# Patient Record
Sex: Female | Born: 1970 | Race: White | Hispanic: No | Marital: Married | Smoking: Never smoker
Health system: Southern US, Community
[De-identification: ages and names within clinical notes are randomized; demographics above are authoritative.]

## PROBLEM LIST (undated history)

## (undated) DIAGNOSIS — G43909 Migraine, unspecified, not intractable, without status migrainosus: Secondary | ICD-10-CM

## (undated) HISTORY — PX: OTHER SURGICAL HISTORY: SHX169

## (undated) HISTORY — PX: BREAST SURGERY: SHX581

---

## 2016-05-04 DIAGNOSIS — R5383 Other fatigue: Secondary | ICD-10-CM | POA: Diagnosis not present

## 2016-05-04 DIAGNOSIS — E039 Hypothyroidism, unspecified: Secondary | ICD-10-CM | POA: Diagnosis not present

## 2016-05-04 DIAGNOSIS — N943 Premenstrual tension syndrome: Secondary | ICD-10-CM | POA: Diagnosis not present

## 2016-05-04 DIAGNOSIS — E7210 Disorders of sulfur-bearing amino-acid metabolism, unspecified: Secondary | ICD-10-CM | POA: Diagnosis not present

## 2016-05-04 DIAGNOSIS — E559 Vitamin D deficiency, unspecified: Secondary | ICD-10-CM | POA: Diagnosis not present

## 2016-05-30 DIAGNOSIS — E7210 Disorders of sulfur-bearing amino-acid metabolism, unspecified: Secondary | ICD-10-CM | POA: Diagnosis not present

## 2016-05-30 DIAGNOSIS — N943 Premenstrual tension syndrome: Secondary | ICD-10-CM | POA: Diagnosis not present

## 2016-05-30 DIAGNOSIS — R51 Headache: Secondary | ICD-10-CM | POA: Diagnosis not present

## 2016-05-30 DIAGNOSIS — F411 Generalized anxiety disorder: Secondary | ICD-10-CM | POA: Diagnosis not present

## 2016-07-01 DIAGNOSIS — Z79899 Other long term (current) drug therapy: Secondary | ICD-10-CM | POA: Diagnosis not present

## 2016-07-01 DIAGNOSIS — F902 Attention-deficit hyperactivity disorder, combined type: Secondary | ICD-10-CM | POA: Diagnosis not present

## 2016-07-01 DIAGNOSIS — R41840 Attention and concentration deficit: Secondary | ICD-10-CM | POA: Diagnosis not present

## 2016-07-01 DIAGNOSIS — F419 Anxiety disorder, unspecified: Secondary | ICD-10-CM | POA: Diagnosis not present

## 2016-08-22 DIAGNOSIS — R5383 Other fatigue: Secondary | ICD-10-CM | POA: Diagnosis not present

## 2016-08-22 DIAGNOSIS — E559 Vitamin D deficiency, unspecified: Secondary | ICD-10-CM | POA: Diagnosis not present

## 2016-08-22 DIAGNOSIS — E039 Hypothyroidism, unspecified: Secondary | ICD-10-CM | POA: Diagnosis not present

## 2016-08-22 DIAGNOSIS — F411 Generalized anxiety disorder: Secondary | ICD-10-CM | POA: Diagnosis not present

## 2016-09-05 DIAGNOSIS — F411 Generalized anxiety disorder: Secondary | ICD-10-CM | POA: Diagnosis not present

## 2016-09-05 DIAGNOSIS — R51 Headache: Secondary | ICD-10-CM | POA: Diagnosis not present

## 2016-09-05 DIAGNOSIS — N943 Premenstrual tension syndrome: Secondary | ICD-10-CM | POA: Diagnosis not present

## 2016-09-05 DIAGNOSIS — E7210 Disorders of sulfur-bearing amino-acid metabolism, unspecified: Secondary | ICD-10-CM | POA: Diagnosis not present

## 2016-09-28 ENCOUNTER — Encounter: Payer: Self-pay | Admitting: *Deleted

## 2016-09-28 ENCOUNTER — Emergency Department (INDEPENDENT_AMBULATORY_CARE_PROVIDER_SITE_OTHER)
Admission: EM | Admit: 2016-09-28 | Discharge: 2016-09-28 | Disposition: A | Discharge status: Home / Self Care | Payer: BLUE CROSS/BLUE SHIELD | Source: Home / Self Care | Attending: Family Medicine | Admitting: Family Medicine

## 2016-09-28 DIAGNOSIS — G43919 Migraine, unspecified, intractable, without status migrainosus: Secondary | ICD-10-CM | POA: Diagnosis not present

## 2016-09-28 HISTORY — DX: Migraine, unspecified, not intractable, without status migrainosus: G43.909

## 2016-09-28 MED ORDER — DEXAMETHASONE SODIUM PHOSPHATE 10 MG/ML IJ SOLN
10.0000 mg | Freq: Once | INTRAMUSCULAR | Status: AC
  Administered 2016-09-28: 10 mg via INTRAMUSCULAR

## 2016-09-28 MED ORDER — ONDANSETRON 4 MG PO TBDP
4.0000 mg | ORAL_TABLET | Freq: Once | ORAL | Status: AC
  Administered 2016-09-28: 4 mg via ORAL

## 2016-09-28 MED ORDER — KETOROLAC TROMETHAMINE 60 MG/2ML IJ SOLN
60.0000 mg | Freq: Once | INTRAMUSCULAR | Status: AC
  Administered 2016-09-28: 60 mg via INTRAMUSCULAR

## 2016-09-28 NOTE — Discharge Instructions (Signed)
Rest. Remain well hydrated.  If symptoms become significantly worse during the night or over the weekend, proceed to the local emergency room.  

## 2016-09-28 NOTE — ED Triage Notes (Signed)
Patient c/o migraine HA x 3 days. Taken Imitrex without relief. Went to [facility] and received a [employer] Cocktail and received a massage. Pain still persists with nausea.

## 2016-09-28 NOTE — ED Provider Notes (Signed)
[facility]    CSN: [id number] Arrival date & time: 09/28/16  1021     History   Chief Complaint Chief Complaint  Patient presents with  . Headache    HPI Madison [name] is a 46 y.o. female.   Patient has a long history of migraine headaches, usually related to onset of menses.  Her headaches have been under control during the past several years, responding to Imitrex. Patient developed a recurrent typical headache two days ago while at work.  She had no improvement after taking Imitrex.  She went to [facility] where she received a "Myers cocktail" and massage without improvement.  She has had nausea without vomiting.   The history is provided by the patient.  Headache  Pain location:  Generalized Quality:  Dull Radiates to:  Does not radiate Severity currently:  8/10 Onset quality:  Sudden Duration:  3 days Timing:  Constant Progression:  Unchanged Chronicity:  Recurrent Similar to prior headaches: yes   Context: activity   Relieved by:  Nothing Worsened by:  Light Ineffective treatments: Imitrex. Associated symptoms: fatigue, nausea and photophobia   Associated symptoms: no abdominal pain, no blurred vision, no congestion, no dizziness, no drainage, no ear pain, no eye pain, no facial pain, no fever, no focal weakness, no hearing loss, no loss of balance, no myalgias, no near-syncope, no neck pain, no neck stiffness, no numbness, no paresthesias, no seizures, no sinus pressure, no sore throat, no visual change, no vomiting and no weakness     Past Medical History:  Diagnosis Date  . Migraine     There are no active problems to display for this patient.   Past Surgical History:  Procedure Laterality Date  . BREAST SURGERY    . OTHER SURGICAL HISTORY     Various reconstruction from accident    OB History    No data available       Home Medications    Prior to Admission medications   Medication Sig Start Date End Date Taking?  Authorizing Provider  progesterone (PROMETRIUM) 100 MG capsule Take 300 mg by mouth daily.   Yes Historical Provider, MD  THYROID PO Take by mouth.   Yes Historical Provider, MD    Family History History reviewed. No pertinent family history.  Social History Social History  Substance Use Topics  . Smoking status: Never Smoker  . Smokeless tobacco: Never Used  . Alcohol use No     Allergies   Lactose intolerance (gi)   Review of Systems Review of Systems  Constitutional: Positive for fatigue. Negative for fever.  HENT: Negative for congestion, ear pain, hearing loss, postnasal drip, sinus pressure and sore throat.   Eyes: Positive for photophobia. Negative for blurred vision and pain.  Cardiovascular: Negative for near-syncope.  Gastrointestinal: Positive for nausea. Negative for abdominal pain and vomiting.  Musculoskeletal: Negative for myalgias, neck pain and neck stiffness.  Neurological: Positive for headaches. Negative for dizziness, focal weakness, seizures, weakness, numbness, paresthesias and loss of balance.  All other systems reviewed and are negative.    Physical Exam Triage Vital Signs ED Triage Vitals  Enc Vitals Group     BP 09/28/16 1113 116/80     Pulse Rate 09/28/16 1113 80     Resp --      Temp 09/28/16 1113 98.2 F (36.8 C)     Temp Source 09/28/16 1113 Oral     SpO2 09/28/16 1113 98 %     Weight 09/28/16 1114  133 lb (60.3 kg)     Height 09/28/16 1114 5\' 4"  (1.626 m)     Head Circumference --      Peak Flow --      Pain Score 09/28/16 1118 8     Pain Loc --      Pain Edu? --      Excl. in GC? --    No data found.   Updated Vital Signs BP 116/80 (BP Location: Left Arm)   Pulse 80   Temp 98.2 F (36.8 C) (Oral)   Ht 5\' 4"  (1.626 m)   Wt 133 lb (60.3 kg)   LMP 09/02/2016   SpO2 98%   BMI 22.83 kg/m   Visual Acuity Right Eye Distance:   Left Eye Distance:   Bilateral Distance:    Right Eye Near:   Left Eye Near:    Bilateral  Near:     Physical Exam Nursing notes and Vital Signs reviewed. Appearance:  Patient appears stated age, and in no acute distress Eyes:  Pupils are equal, round, and reactive to light and accomodation.  Extraocular movement is intact.  Conjunctivae are not inflamed.  Fundi benign.  Mild photophobia present. Ears:  Canals normal.  Tympanic membranes normal.  Nose:   Normal turbinates.  No sinus tenderness.   Pharynx:  Normal Neck:  Supple.  No adenopathy Lungs:  Clear to auscultation.  Breath sounds are equal.  Moving air well. Heart:  Regular rate and rhythm without murmurs, rubs, or gallops.  Abdomen:  Nontender without masses or hepatosplenomegaly.  Bowel sounds are present.  No CVA or flank tenderness.  Extremities:  No edema.  Skin:  No rash present.   Neurologic:  Cranial nerves 2 through 12 are normal.  Patellar, achilles, and elbow reflexes are normal.  Cerebellar function is intact (finger-to-nose and rapid alternating hand movement).  Gait and station are normal.    UC Treatments / Results  Labs (all labs ordered are listed, but only abnormal results are displayed) Labs Reviewed - No data to display  EKG  EKG Interpretation None       Radiology No results found.  Procedures Procedures (including critical care time)  Medications Ordered in UC Medications  ketorolac (TORADOL) injection 60 mg (not administered)  dexamethasone (DECADRON) injection 10 mg (not administered)  ondansetron (ZOFRAN-ODT) disintegrating tablet 4 mg (4 mg Oral Given 09/28/16 1119)     Initial Impression / Assessment and Plan / UC Course  I have reviewed the triage vital signs and the nursing notes.  Pertinent labs & imaging results that were available during my care of the patient were reviewed by me and considered in my medical decision making (see chart for details).  Clinical Course   Administered Zofran ODT 4mg  PO  Administered Toradol 60mg  IM  Administered Decadron 10mg  IM.  Rest.   Remain well hydrated. If symptoms become significantly worse during the night or over the weekend, proceed to the local emergency room.  Followup with Family Doctor if not improved in 5 days.    Final Clinical Impressions(s) / UC Diagnoses   Final diagnoses:  Intractable migraine without status migrainosus, unspecified migraine type    New Prescriptions New Prescriptions   No medications on file     Lattie HawStephen A Beese, MD 10/12/16 [phone]

## 2016-10-07 DIAGNOSIS — Z1231 Encounter for screening mammogram for malignant neoplasm of breast: Secondary | ICD-10-CM | POA: Diagnosis not present

## 2017-01-19 DIAGNOSIS — N943 Premenstrual tension syndrome: Secondary | ICD-10-CM | POA: Diagnosis not present

## 2017-01-19 DIAGNOSIS — E7210 Disorders of sulfur-bearing amino-acid metabolism, unspecified: Secondary | ICD-10-CM | POA: Diagnosis not present

## 2017-01-19 DIAGNOSIS — F411 Generalized anxiety disorder: Secondary | ICD-10-CM | POA: Diagnosis not present

## 2017-01-19 DIAGNOSIS — R51 Headache: Secondary | ICD-10-CM | POA: Diagnosis not present

## 2017-03-09 DIAGNOSIS — E7210 Disorders of sulfur-bearing amino-acid metabolism, unspecified: Secondary | ICD-10-CM | POA: Diagnosis not present

## 2017-03-09 DIAGNOSIS — F411 Generalized anxiety disorder: Secondary | ICD-10-CM | POA: Diagnosis not present

## 2017-03-09 DIAGNOSIS — N943 Premenstrual tension syndrome: Secondary | ICD-10-CM | POA: Diagnosis not present

## 2017-03-09 DIAGNOSIS — R51 Headache: Secondary | ICD-10-CM | POA: Diagnosis not present

## 2017-03-09 DIAGNOSIS — E559 Vitamin D deficiency, unspecified: Secondary | ICD-10-CM | POA: Diagnosis not present

## 2017-04-14 DIAGNOSIS — H5203 Hypermetropia, bilateral: Secondary | ICD-10-CM | POA: Diagnosis not present

## 2017-09-28 DIAGNOSIS — F411 Generalized anxiety disorder: Secondary | ICD-10-CM | POA: Diagnosis not present

## 2017-09-28 DIAGNOSIS — R51 Headache: Secondary | ICD-10-CM | POA: Diagnosis not present

## 2017-09-28 DIAGNOSIS — E7210 Disorders of sulfur-bearing amino-acid metabolism, unspecified: Secondary | ICD-10-CM | POA: Diagnosis not present

## 2017-09-28 DIAGNOSIS — N943 Premenstrual tension syndrome: Secondary | ICD-10-CM | POA: Diagnosis not present

## 2017-11-24 DIAGNOSIS — N943 Premenstrual tension syndrome: Secondary | ICD-10-CM | POA: Diagnosis not present

## 2017-11-24 DIAGNOSIS — E7210 Disorders of sulfur-bearing amino-acid metabolism, unspecified: Secondary | ICD-10-CM | POA: Diagnosis not present

## 2017-11-24 DIAGNOSIS — R51 Headache: Secondary | ICD-10-CM | POA: Diagnosis not present

## 2017-11-24 DIAGNOSIS — E559 Vitamin D deficiency, unspecified: Secondary | ICD-10-CM | POA: Diagnosis not present

## 2017-11-24 DIAGNOSIS — F411 Generalized anxiety disorder: Secondary | ICD-10-CM | POA: Diagnosis not present

## 2018-01-18 DIAGNOSIS — E7210 Disorders of sulfur-bearing amino-acid metabolism, unspecified: Secondary | ICD-10-CM | POA: Diagnosis not present

## 2018-01-18 DIAGNOSIS — R51 Headache: Secondary | ICD-10-CM | POA: Diagnosis not present

## 2018-01-18 DIAGNOSIS — N943 Premenstrual tension syndrome: Secondary | ICD-10-CM | POA: Diagnosis not present

## 2018-01-18 DIAGNOSIS — F411 Generalized anxiety disorder: Secondary | ICD-10-CM | POA: Diagnosis not present

## 2018-01-22 ENCOUNTER — Encounter: Payer: Self-pay | Admitting: Neurology

## 2018-02-20 ENCOUNTER — Ambulatory Visit (INDEPENDENT_AMBULATORY_CARE_PROVIDER_SITE_OTHER): Payer: BLUE CROSS/BLUE SHIELD | Admitting: Neurology

## 2018-02-20 ENCOUNTER — Encounter: Payer: Self-pay | Admitting: Neurology

## 2018-02-20 VITALS — BP 106/68 | HR 72 | Ht 64.0 in | Wt 132.0 lb

## 2018-02-20 DIAGNOSIS — M542 Cervicalgia: Secondary | ICD-10-CM | POA: Diagnosis not present

## 2018-02-20 DIAGNOSIS — G43829 Menstrual migraine, not intractable, without status migrainosus: Secondary | ICD-10-CM

## 2018-02-20 MED ORDER — NAPROXEN 500 MG PO TABS: ORAL_TABLET | ORAL | 2 refills | Status: DC

## 2018-02-20 NOTE — Patient Instructions (Addendum)
Migraine Recommendations: 1.  I will refer you to Dr. Antoine Primas of [facility] to treat your neck pain (which I think is causing your headaches). They should call you directly with an appointment. If you do not hear from them please call [phone]. 2.  Take sumatriptan 50mg  with naproxen 500mg  at earliest onset of headache.  May repeat dose once in 2 hours if needed.  Do not exceed two naproxen in 24 hours (therefore, if you need to take more sumatriptan, you cannot take it with naproxen). 3.  Limit use of pain relievers to no more than 2 days out of the week.  These medications include acetaminophen, ibuprofen, triptans and narcotics.  This will help reduce risk of rebound headaches. 4.  Be aware of common food triggers such as processed sweets, processed foods with nitrites (such as deli meat, hot dogs, sausages), foods with MSG, alcohol (such as wine), chocolate, certain cheeses, certain fruits (dried fruits, bananas, some citrus fruit), vinegar, diet soda. 4.  Avoid caffeine 5.  Routine exercise 6.  Proper sleep hygiene 7.  Stay adequately hydrated with water 8.  Keep a headache diary. 9.  Maintain proper stress management. 10.  Do not skip meals. 11.  Consider supplements:  Magnesium citrate 400mg  to 600mg  daily, riboflavin 400mg , Coenzyme Q 10 100mg  three times daily 12.  Follow up in 3 months.   Migraine Headache A migraine headache is an intense, throbbing pain on one side or both sides of the head. Migraines may also cause other symptoms, such as nausea, vomiting, and sensitivity to light and noise. What are the causes? Doing or taking certain things may also trigger migraines, such as:  Alcohol.  Smoking.  Medicines, such as: ? Medicine used to treat chest pain (nitroglycerine). ? Birth control pills. ? Estrogen pills. ? Certain blood pressure medicines.  Aged cheeses, chocolate, or caffeine.  Foods or drinks that contain nitrates, glutamate, aspartame,  or tyramine.  Physical activity.  Other things that may trigger a migraine include:  Menstruation.  Pregnancy.  Hunger.  Stress, lack of sleep, too much sleep, or fatigue.  Weather changes.  What increases the risk? The following factors may make you more likely to experience migraine headaches:  Age. Risk increases with age.  Family history of migraine headaches.  Being Caucasian.  Depression and anxiety.  Obesity.  Being a woman.  Having a hole in the heart (patent foramen ovale) or other heart problems.  What are the signs or symptoms? The main symptom of this condition is pulsating or throbbing pain. Pain may:  Happen in any area of the head, such as on one side or both sides.  Interfere with daily activities.  Get worse with physical activity.  Get worse with exposure to bright lights or loud noises.  Other symptoms may include:  Nausea.  Vomiting.  Dizziness.  General sensitivity to bright lights, loud noises, or smells.  Before you get a migraine, you may get warning signs that a migraine is developing (aura). An aura may include:  Seeing flashing lights or having blind spots.  Seeing bright spots, halos, or zigzag lines.  Having tunnel vision or blurred vision.  Having numbness or a tingling feeling.  Having trouble talking.  Having muscle weakness.  How is this diagnosed? A migraine headache can be diagnosed based on:  Your symptoms.  A physical exam.  Tests, such as CT scan or MRI of the head. These imaging tests can help rule out other causes of  headaches.  Taking fluid from the spine (lumbar puncture) and analyzing it (cerebrospinal fluid analysis, or CSF analysis).  How is this treated? A migraine headache is usually treated with medicines that:  Relieve pain.  Relieve nausea.  Prevent migraines from coming back.  Treatment may also include:  Acupuncture.  Lifestyle changes like avoiding foods that trigger  migraines.  Follow these instructions at home: Medicines  Take over-the-counter and prescription medicines only as told by your health care provider.  Do not drive or use heavy machinery while taking prescription pain medicine.  To prevent or treat constipation while you are taking prescription pain medicine, your health care provider may recommend that you: ? Drink enough fluid to keep your urine clear or pale yellow. ? Take over-the-counter or prescription medicines. ? Eat foods that are high in fiber, such as fresh fruits and vegetables, whole grains, and beans. ? Limit foods that are high in fat and processed sugars, such as fried and sweet foods. Lifestyle  Avoid alcohol use.  Do not use any products that contain nicotine or tobacco, such as cigarettes and e-cigarettes. If you need help quitting, ask your health care provider.  Get at least 8 hours of sleep every night.  Limit your stress. General instructions   Keep a journal to find out what may trigger your migraine headaches. For example, write down: ? What you eat and drink. ? How much sleep you get. ? Any change to your diet or medicines.  If you have a migraine: ? Avoid things that make your symptoms worse, such as bright lights. ? It may help to lie down in a dark, quiet room. ? Do not drive or use heavy machinery. ? Ask your health care provider what activities are safe for you while you are experiencing symptoms.  Keep all follow-up visits as told by your health care provider. This is important. Contact a health care provider if:  You develop symptoms that are different or more severe than your usual migraine symptoms. Get help right away if:  Your migraine becomes severe.  You have a fever.  You have a stiff neck.  You have vision loss.  Your muscles feel weak or like you cannot control them.  You start to lose your balance often.  You develop trouble walking.  You faint. This information is  not intended to replace advice given to you by your health care provider. Make sure you discuss any questions you have with your health care provider. Document Released: 09/05/2005 Document Revised: 03/25/2016 Document Reviewed: 02/22/2016 Elsevier Interactive Patient Education  2017 [employer]

## 2018-02-20 NOTE — Progress Notes (Signed)
NEUROLOGY CONSULTATION NOTE  Madison [name] MRN: [id number] DOB: [birth year]  Referring provider: Ailene RavelJenny Addison, FNP Primary care provider: Ailene RavelJenny Addison, FNP  Reason for consult:  migraines  HISTORY OF PRESENT ILLNESS: Madison [name] is a 47 year old right-handed female with migraine, anxiety, hypothyroidism who presents for migraines.  History supplemented by referring provider's note  Onset:  Since childhood but worse over past 2 years Location:  Bilateral or unilateral from shoulders up neck to back of head and behind the eyes Quality:  thumping Intensity:  severe.  She denies new headache, thunderclap headache or severe headache that wakes her from sleep. Aura:  no Prodrome:  no Postdrome:  no Associated symptoms:  Nausea, photophobia, phonophobia, sees flashing lights when eyes closed.  Sometimes has vomiting.  She denies associated unilateral numbness or weakness. Duration:  1 to 2 hours with sumatriptan 50mg  but will return later that day.   Frequency:  5-6 times a month. She has one migraine a month that lasts 3 days.  It occurs anywhere from 2 weeks to week of her cycle. Frequency of abortive medication: as needed Triggers/exacerbating factors:  Eye strain (staring at computer screen), menstrual cycle, heat Relieving factors:  Heated rice packs, resting in quiet dark room Activity:  aggravates  Rescue protocol:  1st. Ibuprofen 600mg ; 2nd sumatriptan 50mg  (may repeat 3 more times every 2 hours) Current NSAIDS:  Ibuprofen 600mg  Current analgesics:  no Current triptans:  sumatriptan 50mg  (second dose makes her groggy) Current anti-emetic:  no Current muscle relaxants:  baclofen 10mg  Current anti-anxiolytic:  no Current sleep aide:  no Current Antihypertensive medications:  no Current Antidepressant medications:  no Current Anticonvulsant medications:  no Current Vitamins/Herbal/Supplements:  Magnesium, B2, B3, K2, D3, probiotic, MVI Current  Antihistamines/Decongestants:  no Other therapy:  no Birth control:  progesterone200mg  day 17-28 of cycle  Past NSAIDS:  no Past analgesics:  Tylenol, Excedrin Past abortive triptans:  Relpax (made migraines worse) Past muscle relaxants:  no Past anti-emetic:  no Past antihypertensive medications:  no Past antidepressant medications:  no Past anticonvulsant medications:  no Past vitamins/Herbal/Supplements:  no Past antihistamines/decongestants:  no Other past therapies:  Acupuncture, massage, cupping  Caffeine:  1 cup coffee daily Alcohol:  no Smoker:  no Diet:  Hydrates.  No soda.  Eats healthy Exercise:  yes Depression:  no; Anxiety:  yes Other pain:  Neck pain Sleep hygiene:  okay Family history of headache:  Father, sister  11/24/17:  CMP with Na 142, K 4.9, glucose 78, BUN 14, Cr 0.95, t bili 0.6, ALP 62, AST 16 and ALT 12  PAST MEDICAL HISTORY: Past Medical History:  Diagnosis Date  . Migraine     PAST SURGICAL HISTORY: Past Surgical History:  Procedure Laterality Date  . BREAST SURGERY    . OTHER SURGICAL HISTORY     Various reconstruction from accident    MEDICATIONS: Current Outpatient Medications on File Prior to Visit  Medication Sig Dispense Refill  . Cholecalciferol (VITAMIN D3) 1000 units CAPS Take 1 capsule by mouth daily.    . Menaquinone-7 (VITAMIN K2) 100 MCG CAPS Take 1 capsule by mouth daily.    . Multiple Vitamin (MULTIVITAMIN) tablet Take 1 tablet by mouth daily.    . Niacin (VITAMIN B-3 PO) Take 1 capsule by mouth daily.    . Probiotic Product (PROBIOTIC-10 PO) Take 1 capsule by mouth daily.    . riboflavin (VITAMIN B-2) 100 MG TABS tablet Take 100 mg by mouth daily.    .Marland Kitchen  progesterone (PROMETRIUM) 100 MG capsule Take 300 mg by mouth daily.    . SUMAtriptan (IMITREX) 50 MG tablet Take 50 mg by mouth as directed.  1  . THYROID PO Take by mouth.     No current facility-administered medications on file prior to visit.      ALLERGIES: Allergies  Allergen Reactions  . Lactose Intolerance (Gi)     FAMILY HISTORY: Family History  Problem Relation Age of Onset  . Depression Mother   . Hypertension Father   . Diabetes Father     SOCIAL HISTORY: Social History   Socioeconomic History  . Marital status: Married    Spouse name: Greggory Stallion "Bud"  . Number of children: 2  . Years of education: Not on file  . Highest education level: Bachelor's degree (e.g., BA, AB, BS)  Occupational History  . 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 Financial resource strain: Not on file  . Food insecurity:    Worry: Not on file    Inability: Not on file  . Transportation needs:    Medical: Not on file    Non-medical: Not on file  Tobacco Use  . Smoking status: Never Smoker  . Smokeless tobacco: Never Used  Substance and Sexual Activity  . Alcohol use: No  . Drug use: No  . Sexual activity: Not on file  Lifestyle  . Physical activity:    Days per week: Not on file    Minutes per session: Not on file  . Stress: Not on file  Relationships  . Social connections:    Talks on phone: Not on file    Gets together: Not on file    Attends religious service: Not on file    Active member of club or organization: Not on file    Attends meetings of clubs or organizations: Not on file    Relationship status: Not on file  . Intimate partner violence:    Fear of current or ex partner: Not on file    Emotionally abused: Not on file    Physically abused: Not on file    Forced sexual activity: Not on file  Other Topics Concern  . Not on file  Social History Narrative   Patient is right-handed. She lives with her husband and 2 teenagers in a 2 story house. They have one dog. Patient drinks 1 cup of coffee a day. She walks 2-3 miles, goes to the gym or does yoga daily.     REVIEW OF SYSTEMS: Constitutional: No fevers, chills, or sweats, no generalized fatigue, change in  appetite Eyes: No visual changes, double vision, eye pain Ear, nose and throat: No hearing loss, ear pain, nasal congestion, sore throat Cardiovascular: No chest pain, palpitations Respiratory:  No shortness of breath at rest or with exertion, wheezes GastrointestinaI: No nausea, vomiting, diarrhea, abdominal pain, fecal incontinence Genitourinary:  No dysuria, urinary retention or frequency Musculoskeletal:  Neck pain Integumentary: No rash, pruritus, skin lesions Neurological: as above Psychiatric: anxiety Endocrine: No palpitations, fatigue, diaphoresis, mood swings, change in appetite, change in weight, increased thirst Hematologic/Lymphatic:  No purpura, petechiae. Allergic/Immunologic: no itchy/runny eyes, nasal congestion, recent allergic reactions, rashes  PHYSICAL EXAM: Vitals:   02/20/18 1555  BP: 106/68  Pulse: 72  SpO2: 98%   General: No acute distress.  Patient appears well-groomed.  Head:  Normocephalic/atraumatic Eyes:  fundi examined but not visualized Neck: supple, paraspinal tenderness, full range of motion Back: No paraspinal  tenderness Heart: regular rate and rhythm Lungs: Clear to auscultation bilaterally. Vascular: No carotid bruits. Neurological Exam: Mental status: alert and oriented to person, place, and time, recent and remote memory intact, fund of knowledge intact, attention and concentration intact, speech fluent and not dysarthric, language intact. Cranial nerves: CN I: not tested CN II: pupils equal, round and reactive to light, visual fields intact CN III, IV, VI:  full range of motion, no nystagmus, no ptosis CN V: facial sensation intact CN VII: upper and lower face symmetric CN VIII: hearing intact CN IX, X: gag intact, uvula midline CN XI: sternocleidomastoid and trapezius muscles intact CN XII: tongue midline Bulk & Tone: normal, no fasciculations. Motor:  5/5 throughout  Sensation: temperature and vibration sensation intact. Deep  Tendon Reflexes:  2+ throughout, toes downgoing.  Finger to nose testing:  Without dysmetria.  Heel to shin:  Without dysmetria.  Gait:  Normal station and stride.  Able to turn and tandem walk. Romberg negative.  IMPRESSION: Menstrually-related migraines, not intractable.  There is also a cervicogenic component as well.  PLAN: She would like to avoid pharmacologic treatment for now. 1.  Refer to Sports Medicine for treatment of neck pain 2.  Increasing sumatriptan may cause more drowsiness.  Instead, I will have her take naproxen 500mg  with sumatriptan 50mg .  She may repeat dose with second sumatriptan in 2 hours if needed (no more than 2 tablets in 24 hours) 3.  Limit use of pain relievers to no more than 2 days out of week 4.  Keep headache diary 5.  Mg, B2, CoQ10 6.  Follow up in 3 months.  If headaches not improved, we discussed topiramate.  Thank you for allowing me to take part in the care of this patient.  Shon Millet, DO  CC:  Ailene Ravel, FNP

## 2018-02-28 ENCOUNTER — Encounter: Payer: Self-pay | Admitting: Family Medicine

## 2018-02-28 ENCOUNTER — Other Ambulatory Visit: Payer: Self-pay | Admitting: *Deleted

## 2018-02-28 ENCOUNTER — Ambulatory Visit (INDEPENDENT_AMBULATORY_CARE_PROVIDER_SITE_OTHER): Payer: BLUE CROSS/BLUE SHIELD | Admitting: Family Medicine

## 2018-02-28 ENCOUNTER — Ambulatory Visit (INDEPENDENT_AMBULATORY_CARE_PROVIDER_SITE_OTHER)
Admission: RE | Admit: 2018-02-28 | Discharge: 2018-02-28 | Disposition: A | Discharge status: Home / Self Care | Payer: BLUE CROSS/BLUE SHIELD | Source: Ambulatory Visit | Attending: Family Medicine | Admitting: Family Medicine

## 2018-02-28 VITALS — BP 110/80 | HR 69 | Ht 64.0 in | Wt 132.8 lb

## 2018-02-28 DIAGNOSIS — M999 Biomechanical lesion, unspecified: Secondary | ICD-10-CM | POA: Diagnosis not present

## 2018-02-28 DIAGNOSIS — M542 Cervicalgia: Secondary | ICD-10-CM

## 2018-02-28 DIAGNOSIS — R51 Headache: Secondary | ICD-10-CM | POA: Diagnosis not present

## 2018-02-28 DIAGNOSIS — G4486 Cervicogenic headache: Secondary | ICD-10-CM | POA: Insufficient documentation
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
## 2018-02-28 NOTE — Assessment & Plan Note (Signed)
Decision today to treat with OMT was based on Physical Exam  After verbal consent patient was treated with HVLA, ME, FPR techniques in cervical, thoracic,rib, umbar and sacral areas  Patient tolerated the procedure well with improvement in symptoms  Patient given exercises, stretches and lifestyle modifications  See medications in patient instructions if given  Patient will follow up in 4 weeks

## 2018-02-28 NOTE — Assessment & Plan Note (Signed)
Likely some of the migraines and the cervicogenic headache.  Patient has been on multiple different medications and wants to try to be more conservative and natural.  We discussed over-the-counter medications.  Attempted osteopathic manipulation.  X-rays of neck pending.  Discussed topical anti-inflammatories.  Home exercises for scapular stabilization given.  Follow-up again in 4 weeks

## 2018-02-28 NOTE — Patient Instructions (Signed)
Good to see you.  Ice 20 minutes 2 times daily. Usually after activity and before bed. Keep hands within peripheral vision  Keep monitor at eye level.  Iron 65mg  with 500mg  of vitamin C 3 days before til 3 days after mensuration  Calcium pyruvate 1500mg  daily could help as well  2 tennis ball in a tube sock and lay on them where head meets neck  Exercises 3 times a week.  See me again in 3-4 weeks to make sure you are doing well and will continue manipulation

## 2018-03-06 ENCOUNTER — Ambulatory Visit: Payer: BLUE CROSS/BLUE SHIELD | Admitting: Family Medicine

## 2018-03-14 ENCOUNTER — Telehealth: Payer: Self-pay | Admitting: Family Medicine

## 2018-03-14 ENCOUNTER — Ambulatory Visit: Payer: BLUE CROSS/BLUE SHIELD | Admitting: Family Medicine

## 2018-03-14 NOTE — Telephone Encounter (Signed)
Copied from CRM [id number]. Topic: Inquiry >> Mar 14, 2018 11:05 AM Alexander BergeronBarksdale, Harvey B wrote: Reason for CRM: pt called to ask Dr. Katrinka BlazingSmith about the neck manipulation that was done on her last visit; since the visit she's been having arm and hand trouble on the right side, having difficulty grasping items on the right side, contact pt to advise

## 2018-03-14 NOTE — Telephone Encounter (Signed)
Spoke to pt, she states that the numbness/tingling going down right arm has been going on the past 2 weeks. She said she is unable to grip things in her right hand very well. She would prefer not to take prednisone.

## 2018-03-14 NOTE — Telephone Encounter (Signed)
Interesting  Give it one more day.  Can send in prednisone if she wants.

## 2018-03-15 ENCOUNTER — Ambulatory Visit (INDEPENDENT_AMBULATORY_CARE_PROVIDER_SITE_OTHER): Payer: BLUE CROSS/BLUE SHIELD | Admitting: Family Medicine

## 2018-03-15 ENCOUNTER — Encounter: Payer: Self-pay | Admitting: Family Medicine

## 2018-03-15 VITALS — BP 100/80 | HR 79 | Ht 64.0 in | Wt 133.0 lb

## 2018-03-15 DIAGNOSIS — M999 Biomechanical lesion, unspecified: Secondary | ICD-10-CM

## 2018-03-15 DIAGNOSIS — M5412 Radiculopathy, cervical region: Secondary | ICD-10-CM | POA: Insufficient documentation

## 2018-03-15 MED ORDER — METHYLPREDNISOLONE ACETATE 80 MG/ML IJ SUSP
80.0000 mg | Freq: Once | INTRAMUSCULAR | Status: AC
  Administered 2018-03-15: 80 mg via INTRAMUSCULAR

## 2018-03-15 MED ORDER — KETOROLAC TROMETHAMINE 60 MG/2ML IM SOLN
60.0000 mg | Freq: Once | INTRAMUSCULAR | Status: AC
  Administered 2018-03-15: 60 mg via INTRAMUSCULAR

## 2018-03-15 NOTE — Telephone Encounter (Signed)
We should probably see her then.  Any chance we can find somewhere today I guess.
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
## 2018-03-15 NOTE — Telephone Encounter (Signed)
Pt scheduled today at 745am.

## 2018-03-15 NOTE — Assessment & Plan Note (Signed)
Worsening new symptoms.  Some radicular symptoms.  Did have manipulation greater than 2 weeks ago.  Likely not what was contributing to this.  We discussed icing regimen.  2 injections given today.  Patient declined gabapentin.  Also declined any prednisone.  Declined advanced imaging for any type of herniated disc.  Warned patient that need to monitor for any increasing weakness.  I do believe that there is significant underlying anxiety that is contributing.  Patient's physical exam seems to have some mild weakness but it was fairly inconsistent.  Follow-up again in 1 to 2 weeks

## 2018-03-15 NOTE — Patient Instructions (Addendum)
Good to see you  I am sorry for the flare  I think it will do well  For the wrist tape the wrist when doing repetitive activity and walking the dog.  2 injections today  Starting tomorrow start duexis 1 pill 3 times a day for 3 days then if you need more go for a total of 6 days.  None of my exercises til Monday and then only 3 times a week  Send a message on Monday to tell me how you are doing.  Otherwise see me again on the 11th
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
## 2018-03-29 ENCOUNTER — Encounter: Payer: Self-pay | Admitting: Family Medicine

## 2018-03-29 ENCOUNTER — Ambulatory Visit: Payer: BLUE CROSS/BLUE SHIELD | Admitting: Family Medicine

## 2018-03-29 VITALS — BP 120/82 | HR 64 | Ht 64.0 in | Wt 134.0 lb

## 2018-03-29 DIAGNOSIS — G4486 Cervicogenic headache: Secondary | ICD-10-CM

## 2018-03-29 DIAGNOSIS — M999 Biomechanical lesion, unspecified: Secondary | ICD-10-CM | POA: Diagnosis not present

## 2018-03-29 DIAGNOSIS — R51 Headache: Secondary | ICD-10-CM

## 2018-03-29 MED ORDER — IBUPROFEN-FAMOTIDINE 800-26.6 MG PO TABS: 800.0000 mg | ORAL_TABLET | Freq: Three times a day (TID) | ORAL | 3 refills | Status: DC

## 2018-03-29 NOTE — Assessment & Plan Note (Signed)
No longer having the significant amount of discomfort.  Seems to be having decreasing frequency of the migraines and the cervicogenic headaches.  Patient given self manipulation techniques that I think will be beneficial and we discussed ergonomics.  Encourage patient to get a an adjustable standing desk.  Follow-up with me again in 4 weeks

## 2018-03-29 NOTE — Assessment & Plan Note (Signed)
Decision today to treat with OMT was based on Physical Exam  After verbal consent patient was treated with HVLA, ME, FPR techniques in cervical, thoracic, rib areas  Patient tolerated the procedure well with improvement in symptoms  Patient given exercises, stretches and lifestyle modifications  See medications in patient instructions if given  Patient will follow up in 4 weeks 

## 2018-03-29 NOTE — Patient Instructions (Signed)
Good to see you  Duexis up to 3 times a day as needed Stay active.  2 tennis ball in a tube sock and lay on them where head meets neck  Adjustable standing desk  See me again in 4 weeks

## 2018-04-23 NOTE — Progress Notes (Signed)
Tawana ScaleZach Audine Mangione D.O. [facility] [address] [place], [place] [address] Phone: [phone] Subjective:     CC: Neck pain follow-up  NFA:[id number]:Subjective  Madison [name] is a 47 y.o. female coming in with complaint of neck pain. States that her neck is painful today. Has been doing the exercises.  Patient has noticed that the headaches are not seeming to being with iron deficiency as well.  Patient states continuing to have some difficulties.  Trying to be active.  States that the headaches are still fairly constant    Past Medical History:  Diagnosis Date  . Migraine    Past Surgical History:  Procedure Laterality Date  . BREAST SURGERY    . OTHER SURGICAL HISTORY     Various reconstruction from accident   Social History   Socioeconomic History  . Marital status: Married    Spouse name: Greggory StallionGeorge "Bud"  . Number of children: 2  . Years of education: Not on file  . Highest education level: Bachelor's degree (e.g., BA, AB, BS)  Occupational History  . 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 Financial resource strain: Not on file  . Food insecurity:    Worry: Not on file    Inability: Not on file  . Transportation needs:    Medical: Not on file    Non-medical: Not on file  Tobacco Use  . Smoking status: Never Smoker  . Smokeless tobacco: Never Used  Substance and Sexual Activity  . Alcohol use: No  . Drug use: No  . Sexual activity: Not on file  Lifestyle  . Physical activity:    Days per week: Not on file    Minutes per session: Not on file  . Stress: Not on file  Relationships  . Social connections:    Talks on phone: Not on file    Gets together: Not on file    Attends religious service: Not on file    Active member of club or organization: Not on file    Attends meetings of clubs or organizations: Not on file    Relationship status: Not on file  Other Topics Concern  . Not on file  Social History Narrative   Patient is right-handed. She lives with her husband and 2 teenagers in a 2 story house. They have one dog. Patient drinks 1 cup of coffee a day. She walks 2-3 miles, goes to the gym or does yoga daily.    Allergies  Allergen Reactions  . Lactose Intolerance (Gi)    Family History  Problem Relation Age of Onset  . Depression Mother   . Hypertension Father   . Diabetes Father      Past medical history, social, surgical and family history all reviewed in electronic medical record.  No pertanent information unless stated regarding to the chief complaint.   Review of Systems:Review of systems updated and as accurate as of 04/25/18  No headache, visual changes, nausea, vomiting, diarrhea, constipation, dizziness, abdominal pain, skin rash, fevers, chills, night sweats, weight loss, swollen lymph nodes, body aches, joint swelling, muscle aches, chest pain, shortness of breath, mood changes.   Objective  Blood pressure 102/80, pulse 76, height 5\' 4"  (1.626 m), weight 136 lb (61.7 kg), SpO2 99 %. Systems examined below as of 04/25/18   General: No apparent distress alert and oriented x3 mood and affect normal, dressed appropriately.  HEENT: Pupils equal, extraocular movements intact  Respiratory: Patient's speak in  full sentences and does not appear short of breath  Cardiovascular: No lower extremity edema, non tender, no erythema  Skin: Warm dry intact with no signs of infection or rash on extremities or on axial skeleton.  Abdomen: Soft nontender  Neuro: Cranial nerves II through XII are intact, neurovascularly intact in all extremities with 2+ DTRs and 2+ pulses.  Lymph: No lymphadenopathy of posterior or anterior cervical chain or axillae bilaterally.  Gait normal with good balance and coordination.  MSK:  Non tender with full range of motion and good stability and symmetric strength and tone of shoulders, elbows, wrist, hip, knee and ankles bilaterally.    Neck: Inspection mild loss of  lordosis. No palpable stepoffs. Negative Spurling's maneuver. Grip strength and sensation normal in bilateral hands Strength good C4 to T1 distribution No sensory change to C4 to T1 Negative Hoffman sign bilaterally Reflexes normal Tender to palpation in the trapezius bilaterally  Osteopathic findings  C4 flexed rotated and side bent right  C6 flexed rotated and side bent left T4 extended rotated and side bent right inhaled rib    Impression and Recommendations:     This case required medical decision making of moderate complexity.      Note: This dictation was prepared with Dragon dictation along with smaller phrase technology. Any transcriptional errors that result from this process are unintentional.

## 2018-04-25 ENCOUNTER — Ambulatory Visit (INDEPENDENT_AMBULATORY_CARE_PROVIDER_SITE_OTHER): Payer: BLUE CROSS/BLUE SHIELD | Admitting: Family Medicine

## 2018-04-25 ENCOUNTER — Encounter: Payer: Self-pay | Admitting: Family Medicine

## 2018-04-25 VITALS — BP 102/80 | HR 76 | Ht 64.0 in | Wt 136.0 lb

## 2018-04-25 DIAGNOSIS — G4486 Cervicogenic headache: Secondary | ICD-10-CM

## 2018-04-25 DIAGNOSIS — M999 Biomechanical lesion, unspecified: Secondary | ICD-10-CM | POA: Diagnosis not present

## 2018-04-25 DIAGNOSIS — R51 Headache: Secondary | ICD-10-CM

## 2018-04-25 NOTE — Assessment & Plan Note (Signed)
Decision today to treat with OMT was based on Physical Exam  After verbal consent patient was treated with HVLA, ME, FPR techniques in cervical, thoracic, rib, lumbar and sacral areas  Patient tolerated the procedure well with improvement in symptoms  Patient given exercises, stretches and lifestyle modifications  See medications in patient instructions if given  Patient will follow up in 4 weeks 

## 2018-04-25 NOTE — Assessment & Plan Note (Signed)
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  Responded well to osteopathic manipulation again.  Discussed posture and ergonomics.  Discussed iron deficiency being a possible NG tube.  Patient will continue the medications on a more regular basis.  Follow-up with me again in 4 weeks

## 2018-04-25 NOTE — Patient Instructions (Signed)
Good to see you  Madison [name] is your friend.  Stay active.  Iron 65mg  with 500mg  of vitamin C daily for at least 2 weeks and see how we do.  Keep up everything else Consider red meat 2 times a week  See me again in 5-6 weeks

## 2018-05-03 DIAGNOSIS — R51 Headache: Secondary | ICD-10-CM | POA: Diagnosis not present

## 2018-05-03 DIAGNOSIS — H524 Presbyopia: Secondary | ICD-10-CM | POA: Diagnosis not present

## 2018-05-27 NOTE — Progress Notes (Signed)
[name] [address] [place], [place] [address] Phone: [phone] Subjective:    I [name] am serving as a Neurosurgeon for Dr. Antoine Primas.    CC: Neck pain and headache follow-up  [id number]  Akane Peele-Page is a 47 y.o. female coming in with complaint of headaches. States she's had 4 headaches this month. In the last 2 weeks she's had pain in her left shoulder. Bilaterally she is having arm pain. Left hand has numbness and tingling. Has been using her arms more at work. 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 States that it hurts to pick up a 24oz water bottle.        Past Medical History:  Diagnosis Date  . Migraine    Past Surgical History:  Procedure Laterality Date  . BREAST SURGERY    . OTHER SURGICAL HISTORY     Various reconstruction from accident   Social History   Socioeconomic History  . Marital status: Married    Spouse name: Greggory Stallion "Bud"  . Number of children: 2  . Years of education: Not on file  . Highest education level: Bachelor's degree (e.g., BA, AB, BS)  Occupational History  . 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 Financial resource strain: Not on file  . Food insecurity:    Worry: Not on file    Inability: Not on file  . Transportation needs:    Medical: Not on file    Non-medical: Not on file  Tobacco Use  . Smoking status: Never Smoker  . Smokeless tobacco: Never Used  Substance and Sexual Activity  . Alcohol use: No  . Drug use: No  . Sexual activity: Not on file  Lifestyle  . Physical activity:    Days per week: Not on file    Minutes per session: Not on file  . Stress: Not on file  Relationships  . Social connections:    Talks on phone: Not on file    Gets together: Not on file    Attends religious service: Not on file    Active member of club or organization: Not on file    Attends meetings of clubs or organizations:  Not on file    Relationship status: Not on file  Other Topics Concern  . Not on file  Social History Narrative   Patient is right-handed. She lives with her husband and 2 teenagers in a 2 story house. They have one dog. Patient drinks 1 cup of coffee a day. She walks 2-3 miles, goes to the gym or does yoga daily.    Allergies  Allergen Reactions  . Lactose Intolerance (Gi)    Family History  Problem Relation Age of Onset  . Depression Mother   . Hypertension Father   . Diabetes Father     Current Outpatient Medications (Endocrine & Metabolic):  .  progesterone (PROMETRIUM) 100 MG capsule, Take 300 mg by mouth daily. .  THYROID PO, Take by mouth.    Current Outpatient Medications (Analgesics):  Marland Kitchen  Ibuprofen-Famotidine (DUEXIS) 800-26.6 MG TABS, Take 800 mg by mouth 3 (three) times daily. .  naproxen (NAPROSYN) 500 MG tablet, Take 1 tablet with sumatriptan.  May repeat once in 2 hours with sumatriptan if needed (no more than 2 tablets in 24 hours). .  SUMAtriptan (IMITREX) 50 MG tablet, Take 50 mg by mouth as directed.   Current Outpatient Medications (Other):  .  Cholecalciferol (VITAMIN D3) 1000 units CAPS, Take 1 capsule by mouth daily. .  Menaquinone-7 (VITAMIN K2) 100 MCG CAPS, Take 1 capsule by mouth daily. .  Multiple Vitamin (MULTIVITAMIN) tablet, Take 1 tablet by mouth daily. .  Niacin (VITAMIN B-3 PO), Take 1 capsule by mouth daily. .  Probiotic Product (PROBIOTIC-10 PO), Take 1 capsule by mouth daily. .  riboflavin (VITAMIN B-2) 100 MG TABS tablet, Take 100 mg by mouth daily.    Past medical history, social, surgical and family history all reviewed in electronic medical record.  No pertanent information unless stated regarding to the chief complaint.   Review of Systems:  No  visual changes, nausea, vomiting, diarrhea, constipation, dizziness, abdominal pain, skin rash, fevers, chills, night sweats, weight loss, swollen lymph nodes, body aches, joint swelling,  chest pain, shortness of breath, mood changes.  Positive headache and muscle aches  Objective  Blood pressure 96/70, pulse 90, height 5\' 4"  (1.626 m), weight 133 lb (60.3 kg), SpO2 99 %.    General: No apparent distress alert and oriented x3 mood and affect normal, dressed appropriately.  HEENT: Pupils equal, extraocular movements intact  Respiratory: Patient's speak in full sentences and does not appear short of breath  Cardiovascular: No lower extremity edema, non tender, no erythema  Skin: Warm dry intact with no signs of infection or rash on extremities or on axial skeleton.  Abdomen: Soft nontender  Neuro: Cranial nerves II through XII are intact, neurovascularly intact in all extremities with 2+ DTRs and 2+ pulses.  Lymph: No lymphadenopathy of posterior or anterior cervical chain or axillae bilaterally.  Gait normal with good balance and coordination.  MSK:  Non tender with full range of motion and good stability and symmetric strength and tone of shoulders, elbows, wrist, hip, knee and ankles bilaterally.  Neck: Inspection mild loss of lordosis. No palpable stepoffs. Negative Spurling's maneuver. Mild limited range of motion in all planes of 5 to 10 degrees. Grip strength and sensation normal in bilateral hands Strength good C4 to T1 distribution No sensory change to C4 to T1 Negative Hoffman sign bilaterally Reflexes normal Tightness of the trapezius bilaterally   Osteopathic findings  C2 flexed rotated and side bent right C4 flexed rotated and side bent left C6 flexed rotated and side bent left T3 extended rotated and side bent right inhaled third rib T9 extended rotated and side bent left t    Impression and Recommendations:     This case required medical decision making of moderate complexity. The above documentation has been reviewed and is accurate and complete Judi Saa, DO       Note: This dictation was prepared with Dragon dictation along with  smaller phrase technology. Any transcriptional errors that result from this process are unintentional.

## 2018-05-29 ENCOUNTER — Encounter: Payer: Self-pay | Admitting: Family Medicine

## 2018-05-29 ENCOUNTER — Ambulatory Visit (INDEPENDENT_AMBULATORY_CARE_PROVIDER_SITE_OTHER): Payer: BLUE CROSS/BLUE SHIELD | Admitting: Family Medicine

## 2018-05-29 VITALS — BP 96/70 | HR 90 | Ht 64.0 in | Wt 133.0 lb

## 2018-05-29 DIAGNOSIS — M5412 Radiculopathy, cervical region: Secondary | ICD-10-CM

## 2018-05-29 DIAGNOSIS — M999 Biomechanical lesion, unspecified: Secondary | ICD-10-CM | POA: Diagnosis not present

## 2018-05-29 DIAGNOSIS — R51 Headache: Secondary | ICD-10-CM

## 2018-05-29 DIAGNOSIS — G4486 Cervicogenic headache: Secondary | ICD-10-CM

## 2018-05-29 NOTE — Assessment & Plan Note (Signed)
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  Attempted osteopathic manipulation and responded well.  We discussed posture and ergonomics.  Discussed what other possible changes could be beneficial.  Patient wants to continue with conservative therapy.  If continuing to have some radicular symptoms will need to consider MRI.  Follow-up again in 4 weeks

## 2018-05-29 NOTE — Assessment & Plan Note (Signed)
Decision today to treat with OMT was based on Physical Exam  After verbal consent patient was treated with HVLA, ME, FPR techniques in cervical, thoracic, rib areas  Patient tolerated the procedure well with improvement in symptoms  Patient given exercises, stretches and lifestyle modifications  See medications in patient instructions if given  Patient will follow up in 4-6 weeks 

## 2018-05-29 NOTE — Assessment & Plan Note (Signed)
With again some mild radicular symptoms and numbness of the hands.  Advanced imaging would be warranted if this continues or if any weakness is that follow-up.  Patient will monitor.  Follow-up again in 4 weeks

## 2018-05-29 NOTE — Patient Instructions (Signed)
Good to see you  Ice is your friend Calcium pyruvate either daily or starting 72 hours before menstruation.  Stay active and find time for yourself.  See me again in 4-5 weeks

## 2018-06-07 ENCOUNTER — Encounter: Payer: Self-pay | Admitting: Family Medicine

## 2018-06-07 ENCOUNTER — Ambulatory Visit (INDEPENDENT_AMBULATORY_CARE_PROVIDER_SITE_OTHER): Payer: BLUE CROSS/BLUE SHIELD | Admitting: Family Medicine

## 2018-06-07 VITALS — BP 110/84 | HR 98 | Ht 64.0 in | Wt 132.0 lb

## 2018-06-07 DIAGNOSIS — G4486 Cervicogenic headache: Secondary | ICD-10-CM

## 2018-06-07 DIAGNOSIS — R51 Headache: Secondary | ICD-10-CM

## 2018-06-07 DIAGNOSIS — G43511 Persistent migraine aura without cerebral infarction, intractable, with status migrainosus: Secondary | ICD-10-CM | POA: Diagnosis not present

## 2018-06-07 DIAGNOSIS — G43909 Migraine, unspecified, not intractable, without status migrainosus: Secondary | ICD-10-CM | POA: Insufficient documentation

## 2018-06-07 MED ORDER — KETOROLAC TROMETHAMINE 30 MG/ML IJ SOLN
30.0000 mg | Freq: Once | INTRAMUSCULAR | Status: AC
  Administered 2018-06-07: 30 mg via INTRAMUSCULAR

## 2018-06-07 MED ORDER — PROMETHAZINE HCL 25 MG/ML IJ SOLN
25.0000 mg | Freq: Once | INTRAMUSCULAR | Status: AC
  Administered 2018-06-07: 25 mg via INTRAMUSCULAR

## 2018-06-07 MED ORDER — PROMETHAZINE HCL 25 MG PO TABS: 25.0000 mg | ORAL_TABLET | Freq: Three times a day (TID) | ORAL | 2 refills | Status: DC | PRN

## 2018-06-07 NOTE — Progress Notes (Signed)
[name] [address] [place], [place] [address] Phone: [phone] Subjective:    I Madison [name] am serving as a Neurosurgeon for Dr. Antoine Primas.    CC: Headache follow-up  [id number]  Madison [name] is a 47 y.o. female coming in with complaint of headaches. Has had a migraine since Tuesday night. Took deuxis. Has been vomiting (twice) today. Hasn't been able to sleep.  Patient states that this is the worst headache of her life.  Has had cervicogenic and migraine headaches.  Was responding sumatriptan has not helped break the headache at this time.  Patient feels like her head may explode.     Past Medical History:  Diagnosis Date  . Migraine    Past Surgical History:  Procedure Laterality Date  . BREAST SURGERY    . OTHER SURGICAL HISTORY     Various reconstruction from accident   Social History   Socioeconomic History  . Marital status: Married    Spouse name: Madison [name] "Bud"  . Number of children: 2  . Years of education: Not on file  . Highest education level: Bachelor's degree (e.g., BA, AB, BS)  Occupational History  . 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 Financial resource strain: Not on file  . Food insecurity:    Worry: Not on file    Inability: Not on file  . Transportation needs:    Medical: Not on file    Non-medical: Not on file  Tobacco Use  . Smoking status: Never Smoker  . Smokeless tobacco: Never Used  Substance and Sexual Activity  . Alcohol use: No  . Drug use: No  . Sexual activity: Not on file  Lifestyle  . Physical activity:    Days per week: Not on file    Minutes per session: Not on file  . Stress: Not on file  Relationships  . Social connections:    Talks on phone: Not on file    Gets together: Not on file    Attends religious service: Not on file    Active member of club or organization: Not on file    Attends meetings of clubs or organizations: Not  on file    Relationship status: Not on file  Other Topics Concern  . Not on file  Social History Narrative   Patient is right-handed. She lives with her husband and 2 teenagers in a 2 story house. They have one dog. Patient drinks 1 cup of coffee a day. She walks 2-3 miles, goes to the gym or does yoga daily.    Allergies  Allergen Reactions  . Lactose Intolerance (Gi)    Family History  Problem Relation Age of Onset  . Depression Mother   . Hypertension Father   . Diabetes Father     Current Outpatient Medications (Endocrine & Metabolic):  .  progesterone (PROMETRIUM) 100 MG capsule, Take 300 mg by mouth daily. .  THYROID PO, Take by mouth.   Current Outpatient Medications (Respiratory):  .  promethazine (PHENERGAN) 25 MG tablet, Take 1 tablet (25 mg total) by mouth every 8 (eight) hours as needed for nausea or vomiting.  Current Outpatient Medications (Analgesics):  Marland Kitchen  Ibuprofen-Famotidine (DUEXIS) 800-26.6 MG TABS, Take 800 mg by mouth 3 (three) times daily. .  naproxen (NAPROSYN) 500 MG tablet, Take 1 tablet with sumatriptan.  May repeat once in 2 hours with sumatriptan if needed (no more than 2 tablets in  24 hours). .  SUMAtriptan (IMITREX) 50 MG tablet, Take 50 mg by mouth as directed.   Current Outpatient Medications (Other):  Marland Kitchen.  Cholecalciferol (VITAMIN D3) 1000 units CAPS, Take 1 capsule by mouth daily. .  Menaquinone-7 (VITAMIN K2) 100 MCG CAPS, Take 1 capsule by mouth daily. .  Multiple Vitamin (MULTIVITAMIN) tablet, Take 1 tablet by mouth daily. .  Niacin (VITAMIN B-3 PO), Take 1 capsule by mouth daily. .  Probiotic Product (PROBIOTIC-10 PO), Take 1 capsule by mouth daily. .  riboflavin (VITAMIN B-2) 100 MG TABS tablet, Take 100 mg by mouth daily.    Past medical history, social, surgical and family history all reviewed in electronic medical record.  No pertanent information unless stated regarding to the chief complaint.   Review of Systems:  Novomiting,  diarrhea, constipation, dizziness, abdominal pain, skin rash, fevers, chills, night sweats, weight loss, swollen lymph nodes, body aches, joint swelling, muscle aches, chest pain, shortness of breath, mood changes.  Positive headache, nausea, visual changes  Objective  Blood pressure 110/84, pulse 98, height 5\' 4"  (1.626 m), weight 132 lb (59.9 kg), SpO2 98 %.    General: Appears ill and tearful.  HEENT: Pupils equal, extraocular movements intact pupils do seem to respond well to light.  Patient though does have photophobia Respiratory: Patient's speak in full sentences and does not appear short of breath  Cardiovascular: No lower extremity edema, non tender, no erythema  Skin: Warm dry intact with no signs of infection or rash on extremities or on axial skeleton.  Abdomen: Soft nontender  Neuro: Cranial nerves II through XII are intact, neurovascularly intact in all extremities with 2+ DTRs and 2+ pulses.  Lymph: No lymphadenopathy of posterior or anterior cervical chain or axillae bilaterally.  Gait normal with good balance and coordination.  MSK:  Non tender with full range of motion and good stability and symmetric strength and tone of shoulders, elbows, wrist, hip, knee and ankles bilaterally.  Neck exam shows some tightness but does have full range of motion.  Tender to palpation at the occiput.    Impression and Recommendations:     This case required medical decision making of moderate complexity. The above documentation has been reviewed and is accurate and complete Judi SaaZachary M Kim Lauver       Note: This dictation was prepared with Dragon dictation along with smaller phrase technology. Any transcriptional errors that result from this process are unintentional.

## 2018-06-07 NOTE — Patient Instructions (Addendum)
Good to see you  Madison [name] is your friend.  2 injections today  Phenergen every 8 hours if needed but start with 1/2 tab  We will get mri of the head and neck  Call [phone] tomorrow and set up for the MRI  Make an appointment with me in 3 weeks just in case and we will be talking before that though Worsening pain then go to emergency room.

## 2018-06-07 NOTE — Assessment & Plan Note (Signed)
Patient describes her headache is the worst she has ever had.  Patient appears ill.  Have seen patient multiple times and this is the most severe I have seen her.  Patient feels there is significant pressure in her head, no injury, likely neurologic exam is unremarkable but I do feel advanced imaging is warranted.  Toradol and Phenergan given today.  Hopefully will be beneficial.  Prescription for Phenergan given at home as well.  Patient has her other abortive medications at home.  Patient notes worsening symptoms to go to the emergency room.  Patient will be following up with me again after imaging to discuss

## 2018-06-11 ENCOUNTER — Other Ambulatory Visit: Payer: Self-pay

## 2018-06-11 ENCOUNTER — Telehealth: Payer: Self-pay | Admitting: Family Medicine

## 2018-06-11 DIAGNOSIS — R51 Headache: Principal | ICD-10-CM

## 2018-06-11 DIAGNOSIS — R519 Headache, unspecified: Secondary | ICD-10-CM

## 2018-06-11 NOTE — Telephone Encounter (Signed)
Copied from CRM [id number]. Topic: Quick Communication - See Telephone Encounter >> Jun 11, 2018  1:58 PM Angela NevinWilliams, Candice N wrote: CRM for notification. See Telephone encounter for: 06/11/18.  Mary from [facility] calling to request change of orders-stating that MRI of the neck has to be written as "with or without" or just as "without".  Cb for Mary# [phone]

## 2018-06-14 ENCOUNTER — Ambulatory Visit: Payer: BLUE CROSS/BLUE SHIELD | Admitting: Neurology

## 2018-06-25 ENCOUNTER — Ambulatory Visit
Admission: RE | Admit: 2018-06-25 | Discharge: 2018-06-25 | Disposition: A | Discharge status: Home / Self Care | Payer: BLUE CROSS/BLUE SHIELD | Source: Ambulatory Visit | Attending: Family Medicine | Admitting: Family Medicine

## 2018-06-25 DIAGNOSIS — R51 Headache: Principal | ICD-10-CM

## 2018-06-25 DIAGNOSIS — G4486 Cervicogenic headache: Secondary | ICD-10-CM

## 2018-06-25 DIAGNOSIS — G43909 Migraine, unspecified, not intractable, without status migrainosus: Secondary | ICD-10-CM | POA: Diagnosis not present

## 2018-06-25 DIAGNOSIS — R519 Headache, unspecified: Secondary | ICD-10-CM

## 2018-06-25 MED ORDER — GADOBENATE DIMEGLUMINE 529 MG/ML IV SOLN
12.0000 mL | Freq: Once | INTRAVENOUS | Status: AC | PRN
  Administered 2018-06-25: 12 mL via INTRAVENOUS

## 2018-06-26 ENCOUNTER — Ambulatory Visit: Payer: BLUE CROSS/BLUE SHIELD | Admitting: Family Medicine

## 2018-06-29 ENCOUNTER — Telehealth: Payer: Self-pay

## 2018-06-29 NOTE — Telephone Encounter (Signed)
Copied from CRM [id number]. Topic: General - Other >> Jun 28, 2018  8:24 AM Gean Birchwood R wrote: Patient is calling in for MRI results  CB# [phone] may leave detailed message >> Jun 29, 2018  8:21 AM Gloriann Loan L wrote: Pt calling back about MRI results

## 2018-06-29 NOTE — Telephone Encounter (Signed)
Spoke with patient per MRI results.

## 2018-07-10 ENCOUNTER — Ambulatory Visit: Payer: BLUE CROSS/BLUE SHIELD | Admitting: Family Medicine

## 2018-07-10 ENCOUNTER — Other Ambulatory Visit: Payer: Self-pay

## 2018-07-10 ENCOUNTER — Emergency Department (INDEPENDENT_AMBULATORY_CARE_PROVIDER_SITE_OTHER)
Admission: EM | Admit: 2018-07-10 | Discharge: 2018-07-10 | Disposition: A | Discharge status: Home / Self Care | Payer: BLUE CROSS/BLUE SHIELD | Source: Home / Self Care | Attending: Family Medicine | Admitting: Family Medicine

## 2018-07-10 DIAGNOSIS — B9789 Other viral agents as the cause of diseases classified elsewhere: Secondary | ICD-10-CM | POA: Diagnosis not present

## 2018-07-10 DIAGNOSIS — J9801 Acute bronchospasm: Secondary | ICD-10-CM

## 2018-07-10 DIAGNOSIS — J069 Acute upper respiratory infection, unspecified: Secondary | ICD-10-CM

## 2018-07-10 DIAGNOSIS — M940 Chondrocostal junction syndrome [Tietze]: Secondary | ICD-10-CM

## 2018-07-10 MED ORDER — PREDNISONE 20 MG PO TABS: ORAL_TABLET | ORAL | 0 refills | Status: DC

## 2018-07-10 MED ORDER — AZITHROMYCIN 250 MG PO TABS: ORAL_TABLET | ORAL | 0 refills | Status: DC

## 2018-07-10 MED ORDER — ALBUTEROL SULFATE HFA 108 (90 BASE) MCG/ACT IN AERS: 2.0000 | INHALATION_SPRAY | RESPIRATORY_TRACT | 0 refills | Status: DC | PRN

## 2018-07-10 NOTE — ED Provider Notes (Signed)
[facility]    CSN: [id number] Arrival date & time: 07/10/18  0907     History   Chief Complaint Chief Complaint  Patient presents with  . Cough  . Nasal Congestion    HPI Madison [name] is a 47 y.o. female.   Patient has been fatigued for about a week.  Six days ago she developed a sore throat, sinus congestion, headache, and myalgias.  Yesterday she developed a cough and tightness in her anterior chest.  She has also had low grade fever and sweats, and has developed wheezing without shortness of breath.     Past Medical History:  Diagnosis Date  . Migraine     Patient Active Problem List   Diagnosis Date Noted  . Migraine 06/07/2018  . Cervical radiculopathy 03/15/2018  . 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 Nonallopathic lesion of thoracic region 02/28/2018  . Nonallopathic lesion of rib cage 02/28/2018  . Nonallopathic lesion of cervical region 02/28/2018    Past Surgical History:  Procedure Laterality Date  . BREAST SURGERY    . OTHER SURGICAL HISTORY     Various reconstruction from accident    OB History   None      Home Medications    Prior to Admission medications   Medication Sig Start Date End Date Taking? Authorizing Provider  albuterol (PROVENTIL HFA;VENTOLIN HFA) 108 (90 Base) MCG/ACT inhaler Inhale 2 puffs into the lungs every 4 (four) hours as needed for wheezing or shortness of breath. 07/10/18   Lattie Haw, MD  azithromycin (ZITHROMAX Z-PAK) 250 MG tablet Take 2 tabs today; then begin one tab once daily for 4 more days. (Rx void after 07/18/18) 07/10/18   Lattie Haw, MD  Cholecalciferol (VITAMIN D3) 1000 units CAPS Take 1 capsule by mouth daily.    [provider]  Ibuprofen-Famotidine (DUEXIS) 800-26.6 MG TABS Take 800 mg by mouth 3 (three) times daily. 03/29/18   Judi Saa, DO  Menaquinone-7 (VITAMIN K2) 100 MCG CAPS Take 1 capsule by mouth daily.    [provider]  Multiple Vitamin  (MULTIVITAMIN) tablet Take 1 tablet by mouth daily.    [provider]  naproxen (NAPROSYN) 500 MG tablet Take 1 tablet with sumatriptan.  May repeat once in 2 hours with sumatriptan if needed (no more than 2 tablets in 24 hours). 02/20/18   Drema Dallas, DO  Niacin (VITAMIN B-3 PO) Take 1 capsule by mouth daily.    [provider]  predniSONE (DELTASONE) 20 MG tablet Take one tab by mouth twice daily for 4 days, then one daily for 3 days. Take with food. 07/10/18   Lattie Haw, MD  Probiotic Product (PROBIOTIC-10 PO) Take 1 capsule by mouth daily.    [provider]  progesterone (PROMETRIUM) 100 MG capsule Take 300 mg by mouth daily.    [provider]  promethazine (PHENERGAN) 25 MG tablet Take 1 tablet (25 mg total) by mouth every 8 (eight) hours as needed for nausea or vomiting. 06/07/18   Judi Saa, DO  riboflavin (VITAMIN B-2) 100 MG TABS tablet Take 100 mg by mouth daily.    [provider]  SUMAtriptan (IMITREX) 50 MG tablet Take 50 mg by mouth as directed. 01/22/18   [provider]  THYROID PO Take by mouth.    [provider]    Family History Family History  Problem Relation Age of Onset  . Depression Mother   . Hypertension Father   .  Diabetes Father     Social History Social History   Tobacco Use  . Smoking status: Never Smoker  . Smokeless tobacco: Never Used  Substance Use Topics  . Alcohol use: No  . Drug use: No     Allergies   Lactose intolerance (gi)   Review of Systems Review of Systems + sore throat + cough No pleuritic pain, but feels tight in anterior chest + wheezing + nasal congestion + post-nasal drainage No sinus pain/pressure No itchy/red eyes No earache No hemoptysis No SOB + fever, + chills No nausea No vomiting No abdominal pain No diarrhea No urinary symptoms No skin rash + fatigue + myalgias + headache Used OTC meds without relief   Physical  Exam Triage Vital Signs ED Triage Vitals  Enc Vitals Group     BP 07/10/18 0934 124/74     Pulse Rate 07/10/18 0934 81     Resp --      Temp 07/10/18 0934 97.9 F (36.6 C)     Temp Source 07/10/18 0934 Oral     SpO2 07/10/18 0934 100 %     Weight 07/10/18 0935 134 lb (60.8 kg)     Height 07/10/18 0935 5' 3.5" (1.613 m)     Head Circumference --      Peak Flow --      Pain Score 07/10/18 0935 1     Pain Loc --      Pain Edu? --      Excl. in GC? --    No data found.  Updated Vital Signs BP 124/74 (BP Location: Right Arm)   Pulse 81   Temp 97.9 F (36.6 C) (Oral)   Ht 5' 3.5" (1.613 m)   Wt 60.8 kg   LMP 06/26/2018 (Approximate)   SpO2 100%   BMI 23.36 kg/m   Visual Acuity Right Eye Distance:   Left Eye Distance:   Bilateral Distance:    Right Eye Near:   Left Eye Near:    Bilateral Near:     Physical Exam Nursing notes and Vital Signs reviewed. Appearance:  Patient appears stated age, and in no acute distress Eyes:  Pupils are equal, round, and reactive to light and accomodation.  Extraocular movement is intact.  Conjunctivae are not inflamed  Ears:  Canals normal.  Tympanic membranes normal.  Nose:  Mildly congested turbinates.  No sinus tenderness.   Pharynx:  Normal Neck:  Supple.  Enlarged posterior/lateral nodes are palpated bilaterally, tender to palpation on the left.   Lungs:  Clear to auscultation.  Breath sounds are equal.  Moving air well. Chest:  Distinct tenderness to palpation over the mid-sternum.  Heart:  Regular rate and rhythm without murmurs, rubs, or gallops.  Abdomen:  Nontender without masses or hepatosplenomegaly.  Bowel sounds are present.  No CVA or flank tenderness.  Extremities:  No edema.  Skin:  No rash present.    UC Treatments / Results  Labs (all labs ordered are listed, but only abnormal results are displayed) Labs Reviewed - No data to display  EKG None  Radiology No results found.  Procedures Procedures (including  critical care time)  Medications Ordered in UC Medications - No data to display  Initial Impression / Assessment and Plan / UC Course  I have reviewed the triage vital signs and the nursing notes.  Pertinent labs & imaging results that were available during my care of the patient were reviewed by me and considered in my medical decision  making (see chart for details).    There is no evidence of bacterial infection today.  Begin prednisone burst/taper. Rx for albuterol MDI. Followup with Family Doctor if not improved in about 10 days.   Final Clinical Impressions(s) / UC Diagnoses   Final diagnoses:  Viral URI with cough  Bronchospasm, acute  Costochondritis     Discharge Instructions     Take plain guaifenesin (1200mg  extended release tabs such as Mucinex) twice daily, with plenty of water, for cough and congestion.  May add Pseudoephedrine (30mg , one or two every 4 to 6 hours) for sinus congestion.  Get adequate rest.   May use Afrin nasal spray (or generic oxymetazoline) each morning for about 5 days and then discontinue.  Also recommend using saline nasal spray several times daily and saline nasal irrigation (AYR is a common brand).  Use Flonase nasal spray each morning after using Afrin nasal spray and saline nasal irrigation. Try warm salt water gargles for sore throat.  Stop all antihistamines for now, and other non-prescription cough/cold preparations. May take Delsym Cough Suppressant at bedtime for nighttime cough.  Begin Azithromycin if not improving about one week or if persistent fever develops  (Given a prescription to hold, with an expiration date)      ED Prescriptions    Medication Sig Dispense Auth. Provider   predniSONE (DELTASONE) 20 MG tablet Take one tab by mouth twice daily for 4 days, then one daily for 3 days. Take with food. 11 tablet Lattie Haw, MD   albuterol (PROVENTIL HFA;VENTOLIN HFA) 108 (90 Base) MCG/ACT inhaler Inhale 2 puffs into the  lungs every 4 (four) hours as needed for wheezing or shortness of breath. 1 Inhaler Lattie Haw, MD   azithromycin (ZITHROMAX Z-PAK) 250 MG tablet Take 2 tabs today; then begin one tab once daily for 4 more days. (Rx void after 07/18/18) 6 tablet Lattie Haw, MD        Lattie Haw, MD 07/12/18 1800

## 2018-07-10 NOTE — ED Triage Notes (Signed)
Pt has had fatigue for about a week, Thursday night started with congestion, cough- productive- greenish yellow.  Low grade fever.

## 2018-07-10 NOTE — Discharge Instructions (Addendum)
Take plain guaifenesin (1200mg extended release tabs such as Mucinex) twice daily, with plenty of water, for cough and congestion.  May add Pseudoephedrine (30mg, one or two every 4 to 6 hours) for sinus congestion.  Get adequate rest.   °May use Afrin nasal spray (or generic oxymetazoline) each morning for about 5 days and then discontinue.  Also recommend using saline nasal spray several times daily and saline nasal irrigation (AYR is a common brand).  Use Flonase nasal spray each morning after using Afrin nasal spray and saline nasal irrigation. °Try warm salt water gargles for sore throat.  °Stop all antihistamines for now, and other non-prescription cough/cold preparations. °May take Delsym Cough Suppressant at bedtime for nighttime cough.  °Begin Azithromycin if not improving about one week or if persistent fever develops.  °

## 2018-07-18 DIAGNOSIS — N943 Premenstrual tension syndrome: Secondary | ICD-10-CM | POA: Diagnosis not present

## 2018-07-18 DIAGNOSIS — R51 Headache: Secondary | ICD-10-CM | POA: Diagnosis not present

## 2018-07-18 DIAGNOSIS — F411 Generalized anxiety disorder: Secondary | ICD-10-CM | POA: Diagnosis not present

## 2018-07-18 DIAGNOSIS — E7210 Disorders of sulfur-bearing amino-acid metabolism, unspecified: Secondary | ICD-10-CM | POA: Diagnosis not present

## 2018-07-24 NOTE — Progress Notes (Signed)
[name] [address] [place], [place] [address] Phone: [phone] Subjective:    I [name] am serving as a Neurosurgeon for Dr. Antoine Primas.    CC: Neck pain  BJY:NWGNFAOZHY  Madison [name] is a 47 y.o. female coming in with complaint of neck pain. States that she has a catch on the right side of her neck.  Patient has been doing much better.  Patient was having severe amount of headaches and we did get an MRI angiogram was unremarkable.  Patient states that she has been doing the vitamins, doing multiple other things as well including acupuncture which seems to be helping a little by little.  Still taking her abortive migraine medications multiple days a week.    Past Medical History:  Diagnosis Date  . Migraine    Past Surgical History:  Procedure Laterality Date  . BREAST SURGERY    . OTHER SURGICAL HISTORY     Various reconstruction from accident   Social History   Socioeconomic History  . Marital status: Married    Spouse name: Greggory Stallion "Bud"  . Number of children: 2  . Years of education: Not on file  . Highest education level: Bachelor's degree (e.g., BA, AB, BS)  Occupational History  . 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 Financial resource strain: Not on file  . Food insecurity:    Worry: Not on file    Inability: Not on file  . Transportation needs:    Medical: Not on file    Non-medical: Not on file  Tobacco Use  . Smoking status: Never Smoker  . Smokeless tobacco: Never Used  Substance and Sexual Activity  . Alcohol use: No  . Drug use: No  . Sexual activity: Not on file  Lifestyle  . Physical activity:    Days per week: Not on file    Minutes per session: Not on file  . Stress: Not on file  Relationships  . Social connections:    Talks on phone: Not on file    Gets together: Not on file    Attends religious service: Not on file    Active member of club or  organization: Not on file    Attends meetings of clubs or organizations: Not on file    Relationship status: Not on file  Other Topics Concern  . Not on file  Social History Narrative   Patient is right-handed. She lives with her husband and 2 teenagers in a 2 story house. They have one dog. Patient drinks 1 cup of coffee a day. She walks 2-3 miles, goes to the gym or does yoga daily.    Allergies  Allergen Reactions  . Lactose Intolerance (Gi)    Family History  Problem Relation Age of Onset  . Depression Mother   . Hypertension Father   . Diabetes Father     Current Outpatient Medications (Endocrine & Metabolic):  .  progesterone (PROMETRIUM) 100 MG capsule, Take 300 mg by mouth daily. .  THYROID PO, Take by mouth. .  predniSONE (DELTASONE) 20 MG tablet, Take one tab by mouth twice daily for 4 days, then one daily for 3 days. Take with food. (Patient not taking: Reported on 07/26/2018)   Current Outpatient Medications (Respiratory):  .  albuterol (PROVENTIL HFA;VENTOLIN HFA) 108 (90 Base) MCG/ACT inhaler, Inhale 2 puffs into the lungs every 4 (four) hours as needed for wheezing or shortness of  breath. .  promethazine (PHENERGAN) 25 MG tablet, Take 1 tablet (25 mg total) by mouth every 8 (eight) hours as needed for nausea or vomiting. (Patient not taking: Reported on 07/26/2018)  Current Outpatient Medications (Analgesics):  Marland Kitchen  Ibuprofen-Famotidine (DUEXIS) 800-26.6 MG TABS, Take 800 mg by mouth 3 (three) times daily. Marland Kitchen  ketorolac (TORADOL) 10 MG tablet, Take 1 tablet (10 mg total) by mouth every 8 (eight) hours as needed. .  naproxen (NAPROSYN) 500 MG tablet, Take 1 tablet with sumatriptan.  May repeat once in 2 hours with sumatriptan if needed (no more than 2 tablets in 24 hours). (Patient not taking: Reported on 07/26/2018) .  SUMAtriptan (IMITREX) 50 MG tablet, Take 50 mg by mouth as directed.   Current Outpatient Medications (Other):  Marland Kitchen  Cholecalciferol (VITAMIN D3) 1000 units  CAPS, Take 1 capsule by mouth daily. .  Menaquinone-7 (VITAMIN K2) 100 MCG CAPS, Take 1 capsule by mouth daily. .  Multiple Vitamin (MULTIVITAMIN) tablet, Take 1 tablet by mouth daily. .  Niacin (VITAMIN B-3 PO), Take 1 capsule by mouth daily. .  Probiotic Product (PROBIOTIC-10 PO), Take 1 capsule by mouth daily. .  riboflavin (VITAMIN B-2) 100 MG TABS tablet, Take 100 mg by mouth daily. Marland Kitchen  azithromycin (ZITHROMAX Z-PAK) 250 MG tablet, Take 2 tabs today; then begin one tab once daily for 4 more days. (Rx void after 07/18/18) (Patient not taking: Reported on 07/26/2018)    Past medical history, social, surgical and family history all reviewed in electronic medical record.  No pertanent information unless stated regarding to the chief complaint.   Review of Systems:  No , visual changes, nausea, vomiting, diarrhea, constipation, dizziness, abdominal pain, skin rash, fevers, chills, night sweats, weight loss, swollen lymph nodes, body aches, joint swelling,  chest pain, shortness of breath, mood changes.  Positive headache muscle aches  Objective  Blood pressure 102/80, pulse 89, height 5' 3.5" (1.613 m), weight 134 lb (60.8 kg), last menstrual period 06/26/2018, SpO2 98 %. Systems examined below as of    General: No apparent distress alert and oriented x3 mood and affect normal, dressed appropriately.  HEENT: Pupils equal, extraocular movements intact  Respiratory: Patient's speak in full sentences and does not appear short of breath  Cardiovascular: No lower extremity edema, non tender, no erythema  Skin: Warm dry intact with no signs of infection or rash on extremities or on axial skeleton.  Abdomen: Soft nontender  Neuro: Cranial nerves II through XII are intact, neurovascularly intact in all extremities with 2+ DTRs and 2+ pulses.  Lymph: No lymphadenopathy of posterior or anterior cervical chain or axillae bilaterally.  Gait normal with good balance and coordination.  MSK:  Non tender  with full range of motion and good stability and symmetric strength and tone of shoulders, elbows, wrist, hip, knee and ankles bilaterally.  Neck: Inspection unremarkable. No palpable stepoffs. Negative Spurling's maneuver. Full neck range of motion mild tightness noted in the terminal range of motion Grip strength and sensation normal in bilateral hands Strength good C4 to T1 distribution No sensory change to C4 to T1 Negative Hoffman sign bilaterally Reflexes normal Tightness noted at the occipital level as well as at the trapezius is bilaterally.  Osteopathic findings C2 flexed rotated and side bent right C4 flexed rotated and side bent left C6 flexed rotated and side bent left T3 extended rotated and side bent right inhaled third rib    Impression and Recommendations:     This case required medical  decision making of moderate complexity. The above documentation has been reviewed and is accurate and complete Lyndal Pulley, DO       Note: This dictation was prepared with Dragon dictation along with smaller phrase technology. Any transcriptional errors that result from this process are unintentional.

## 2018-07-26 ENCOUNTER — Ambulatory Visit (INDEPENDENT_AMBULATORY_CARE_PROVIDER_SITE_OTHER): Payer: BLUE CROSS/BLUE SHIELD | Admitting: Family Medicine

## 2018-07-26 ENCOUNTER — Encounter: Payer: Self-pay | Admitting: Family Medicine

## 2018-07-26 VITALS — BP 102/80 | HR 89 | Ht 63.5 in | Wt 134.0 lb

## 2018-07-26 DIAGNOSIS — G4486 Cervicogenic headache: Secondary | ICD-10-CM

## 2018-07-26 DIAGNOSIS — M999 Biomechanical lesion, unspecified: Secondary | ICD-10-CM | POA: Diagnosis not present

## 2018-07-26 DIAGNOSIS — R51 Headache: Secondary | ICD-10-CM | POA: Diagnosis not present

## 2018-07-26 MED ORDER — KETOROLAC TROMETHAMINE 10 MG PO TABS: 10.0000 mg | ORAL_TABLET | Freq: Three times a day (TID) | ORAL | 0 refills | Status: DC | PRN

## 2018-07-26 NOTE — Assessment & Plan Note (Signed)
Decision today to treat with OMT was based on Physical Exam  After verbal consent patient was treated with HVLA, ME, FPR techniques in cervical, thoracic, rib areas  Patient tolerated the procedure well with improvement in symptoms  Patient given exercises, stretches and lifestyle modifications  See medications in patient instructions if given  Patient will follow up in 4-8 weeks 

## 2018-07-26 NOTE — Patient Instructions (Signed)
Great to see you  Stay active Madison [name] is your friend Continue the vitamins If you get a headache try triptan first then if not better try the toradol  You are doing great  See me again in 5-6 weeks

## 2018-07-26 NOTE — Assessment & Plan Note (Signed)
Continues to have some mild cervicogenic headaches.  Discussed posture, ergonomics, which activities of doing which wants to avoid.  Increase activity as tolerated.  Follow-up again in 4 to 8 weeks

## 2018-08-21 DIAGNOSIS — N943 Premenstrual tension syndrome: Secondary | ICD-10-CM | POA: Diagnosis not present

## 2018-08-21 DIAGNOSIS — R5381 Other malaise: Secondary | ICD-10-CM | POA: Diagnosis not present

## 2018-08-21 DIAGNOSIS — F411 Generalized anxiety disorder: Secondary | ICD-10-CM | POA: Diagnosis not present

## 2018-08-21 DIAGNOSIS — R51 Headache: Secondary | ICD-10-CM | POA: Diagnosis not present

## 2018-08-21 DIAGNOSIS — E7212 Methylenetetrahydrofolate reductase deficiency: Secondary | ICD-10-CM | POA: Diagnosis not present

## 2018-08-21 DIAGNOSIS — E7210 Disorders of sulfur-bearing amino-acid metabolism, unspecified: Secondary | ICD-10-CM | POA: Diagnosis not present

## 2018-08-27 DIAGNOSIS — G43111 Migraine with aura, intractable, with status migrainosus: Secondary | ICD-10-CM | POA: Diagnosis not present

## 2018-08-27 DIAGNOSIS — M542 Cervicalgia: Secondary | ICD-10-CM | POA: Diagnosis not present

## 2018-08-28 NOTE — Progress Notes (Signed)
[name] [address] [place], [place] [address] Phone: [phone] Subjective:     CC: Back neck pain and headache follow-up  BJY:NWGNFAOZHY  Madison [name] is a 47 y.o. female coming in with complaint of neck pain and headaches.  Found to have cervicogenic headaches.  Also has history of migraines.  Had a bad migraine earlier this week.  Was seen integrative therapy doctor who did just put her on higher dose estrogen.  Seems that when she took it that could have exacerbated it.  Has not taken it since and is starting to feel better at this time.     Past Medical History:  Diagnosis Date  . Migraine    Past Surgical History:  Procedure Laterality Date  . BREAST SURGERY    . OTHER SURGICAL HISTORY     Various reconstruction from accident   Social History   Socioeconomic History  . Marital status: Married    Spouse name: Greggory Stallion "Bud"  . Number of children: 2  . Years of education: Not on file  . Highest education level: Bachelor's degree (e.g., BA, AB, BS)  Occupational History  . 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 Financial resource strain: Not on file  . Food insecurity:    Worry: Not on file    Inability: Not on file  . Transportation needs:    Medical: Not on file    Non-medical: Not on file  Tobacco Use  . Smoking status: Never Smoker  . Smokeless tobacco: Never Used  Substance and Sexual Activity  . Alcohol use: No  . Drug use: No  . Sexual activity: Not on file  Lifestyle  . Physical activity:    Days per week: Not on file    Minutes per session: Not on file  . Stress: Not on file  Relationships  . Social connections:    Talks on phone: Not on file    Gets together: Not on file    Attends religious service: Not on file    Active member of club or organization: Not on file    Attends meetings of clubs or organizations: Not on file    Relationship status: Not on file    Other Topics Concern  . Not on file  Social History Narrative   Patient is right-handed. She lives with her husband and 2 teenagers in a 2 story house. They have one dog. Patient drinks 1 cup of coffee a day. She walks 2-3 miles, goes to the gym or does yoga daily.    Allergies  Allergen Reactions  . Lactose Intolerance (Gi)    Family History  Problem Relation Age of Onset  . Depression Mother   . Hypertension Father   . Diabetes Father     Current Outpatient Medications (Endocrine & Metabolic):  .  progesterone (PROMETRIUM) 100 MG capsule, Take 300 mg by mouth daily. .  THYROID PO, Take by mouth.    Current Outpatient Medications (Analgesics):  Marland Kitchen  Ibuprofen-Famotidine (DUEXIS) 800-26.6 MG TABS, Take 800 mg by mouth 3 (three) times daily. .  SUMAtriptan (IMITREX) 50 MG tablet, Take 50 mg by mouth as directed.   Current Outpatient Medications (Other):  Marland Kitchen  Cholecalciferol (VITAMIN D3) 1000 units CAPS, Take 1 capsule by mouth daily. .  Menaquinone-7 (VITAMIN K2) 100 MCG CAPS, Take 1 capsule by mouth daily. .  Multiple Vitamin (MULTIVITAMIN) tablet, Take 1 tablet by mouth daily. Marland Kitchen  Probiotic Product (PROBIOTIC-10 PO), Take 1 capsule by mouth daily. .  riboflavin (VITAMIN B-2) 100 MG TABS tablet, Take 100 mg by mouth daily.    Past medical history, social, surgical and family history all reviewed in electronic medical record.  No pertanent information unless stated regarding to the chief complaint.   Review of Systems:  No  visual changes, nausea, vomiting, diarrhea, constipation, dizziness, abdominal pain, skin rash, fevers, chills, night sweats, weight loss, swollen lymph nodes, body aches, joint swelling,  chest pain, shortness of breath, mood changes.  Positive headaches, muscle aches  Objective  Blood pressure 124/82, pulse 81, resp. rate 16, weight 135 lb (61.2 kg), SpO2 98 %.     General: No apparent distress alert and oriented x3 mood and affect normal, dressed  appropriately.  HEENT: Pupils equal, extraocular movements intact  Respiratory: Patient's speak in full sentences and does not appear short of breath  Cardiovascular: No lower extremity edema, non tender, no erythema  Skin: Warm dry intact with no signs of infection or rash on extremities or on axial skeleton.  Abdomen: Soft nontender  Neuro: Cranial nerves II through XII are intact, neurovascularly intact in all extremities with 2+ DTRs and 2+ pulses.  Lymph: No lymphadenopathy of posterior or anterior cervical chain or axillae bilaterally.  Gait normal with good balance and coordination.  MSK:  Non tender with full range of motion and good stability and symmetric strength and tone of shoulders, elbows, wrist, hip, knee and ankles bilaterally.  Neck: Inspection loss of lordosis. No palpable stepoffs. Negative Spurling's maneuver. Lacks last 5 degrees of rotation and sidebending bilaterally Grip strength and sensation normal in bilateral hands Strength good C4 to T1 distribution No sensory change to C4 to T1 Negative Hoffman sign bilaterally Reflexes normal Significant tightness of the occiput  Osteopathic findings C2 flexed rotated and side bent right T3 extended rotated and side bent right inhaled third rib T11 extended rotated and side bent right      Impression and Recommendations:     This case required medical decision making of moderate complexity. The above documentation has been reviewed and is accurate and complete Judi SaaZachary M Draken Farrior, DO       Note: This dictation was prepared with Dragon dictation along with smaller phrase technology. Any transcriptional errors that result from this process are unintentional.

## 2018-08-29 ENCOUNTER — Encounter: Payer: Self-pay | Admitting: Family Medicine

## 2018-08-29 ENCOUNTER — Ambulatory Visit (INDEPENDENT_AMBULATORY_CARE_PROVIDER_SITE_OTHER): Payer: BLUE CROSS/BLUE SHIELD | Admitting: Family Medicine

## 2018-08-29 VITALS — BP 124/82 | HR 81 | Resp 16 | Wt 135.0 lb

## 2018-08-29 DIAGNOSIS — R51 Headache: Secondary | ICD-10-CM

## 2018-08-29 DIAGNOSIS — M999 Biomechanical lesion, unspecified: Secondary | ICD-10-CM | POA: Diagnosis not present

## 2018-08-29 DIAGNOSIS — G4486 Cervicogenic headache: Secondary | ICD-10-CM

## 2018-08-29 NOTE — Assessment & Plan Note (Signed)
Decision today to treat with OMT was based on Physical Exam  After verbal consent patient was treated with HVLA, ME, FPR techniques in cervical, thoracic, rib areas  Patient tolerated the procedure well with improvement in symptoms  Patient given exercises, stretches and lifestyle modifications  See medications in patient instructions if given  Patient will follow up in 4-6 weeks 

## 2018-08-29 NOTE — Patient Instructions (Signed)
Great to see you  Try the dry needling a little longer  Stay active Have a great holiday  See em again in 4-5 weeks

## 2018-08-29 NOTE — Assessment & Plan Note (Signed)
Discussed with patient about how estrogen potentially can cause headaches.  Patient is also doing testosterone pellets that could also be contributing.  We discussed icing regimen and home exercises.  Discussed posture and ergonomics.  Discussed proper lifting mechanics.  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  Follow-up again in 4 to 6 weeks

## 2018-09-04 DIAGNOSIS — M542 Cervicalgia: Secondary | ICD-10-CM | POA: Diagnosis not present

## 2018-09-04 DIAGNOSIS — G43111 Migraine with aura, intractable, with status migrainosus: Secondary | ICD-10-CM | POA: Diagnosis not present

## 2018-09-10 DIAGNOSIS — G43111 Migraine with aura, intractable, with status migrainosus: Secondary | ICD-10-CM | POA: Diagnosis not present

## 2018-09-10 DIAGNOSIS — M542 Cervicalgia: Secondary | ICD-10-CM | POA: Diagnosis not present

## 2018-09-26 DIAGNOSIS — G43111 Migraine with aura, intractable, with status migrainosus: Secondary | ICD-10-CM | POA: Diagnosis not present

## 2018-09-26 DIAGNOSIS — M542 Cervicalgia: Secondary | ICD-10-CM | POA: Diagnosis not present

## 2018-09-27 DIAGNOSIS — R5383 Other fatigue: Secondary | ICD-10-CM | POA: Diagnosis not present

## 2018-09-27 DIAGNOSIS — N390 Urinary tract infection, site not specified: Secondary | ICD-10-CM | POA: Diagnosis not present

## 2018-10-08 DIAGNOSIS — G43111 Migraine with aura, intractable, with status migrainosus: Secondary | ICD-10-CM | POA: Diagnosis not present

## 2018-10-08 DIAGNOSIS — M542 Cervicalgia: Secondary | ICD-10-CM | POA: Diagnosis not present

## 2018-10-10 DIAGNOSIS — M542 Cervicalgia: Secondary | ICD-10-CM | POA: Diagnosis not present

## 2018-10-10 DIAGNOSIS — G43111 Migraine with aura, intractable, with status migrainosus: Secondary | ICD-10-CM | POA: Diagnosis not present

## 2018-10-11 ENCOUNTER — Ambulatory Visit: Payer: BLUE CROSS/BLUE SHIELD | Admitting: Family Medicine

## 2018-10-11 ENCOUNTER — Encounter: Payer: Self-pay | Admitting: Family Medicine

## 2018-10-11 VITALS — BP 130/90 | HR 73 | Ht 63.5 in | Wt 138.0 lb

## 2018-10-11 DIAGNOSIS — M999 Biomechanical lesion, unspecified: Secondary | ICD-10-CM | POA: Diagnosis not present

## 2018-10-11 DIAGNOSIS — G4486 Cervicogenic headache: Secondary | ICD-10-CM

## 2018-10-11 DIAGNOSIS — R51 Headache: Secondary | ICD-10-CM

## 2018-10-11 NOTE — Progress Notes (Signed)
[name] [address] [place], [place] [address] Phone: [phone] Subjective:    I [name] am serving as a Neurosurgeon for Dr. Antoine Primas.     CC: Headache and neck pain follow-up  [id number]  Madison [name] is a 48 y.o. female coming in with complaint of back pain. States that she is doing well. Migraine last week while out of town but overall doing well. Has been doing dry needling. Left side feels "off".      Past Medical History:  Diagnosis Date  . Migraine    Past Surgical History:  Procedure Laterality Date  . BREAST SURGERY    . OTHER SURGICAL HISTORY     Various reconstruction from accident   Social History   Socioeconomic History  . Marital status: Married    Spouse name: Madison Stallion "Bud"  . Number of children: 2  . Years of education: Not on file  . Highest education level: Bachelor's degree (e.g., BA, AB, BS)  Occupational History  . 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 Financial resource strain: Not on file  . Food insecurity:    Worry: Not on file    Inability: Not on file  . Transportation needs:    Medical: Not on file    Non-medical: Not on file  Tobacco Use  . Smoking status: Never Smoker  . Smokeless tobacco: Never Used  Substance and Sexual Activity  . Alcohol use: No  . Drug use: No  . Sexual activity: Not on file  Lifestyle  . Physical activity:    Days per week: Not on file    Minutes per session: Not on file  . Stress: Not on file  Relationships  . Social connections:    Talks on phone: Not on file    Gets together: Not on file    Attends religious service: Not on file    Active member of club or organization: Not on file    Attends meetings of clubs or organizations: Not on file    Relationship status: Not on file  Other Topics Concern  . Not on file  Social History Narrative   Patient is right-handed. She lives with her husband and 2  teenagers in a 2 story house. They have one dog. Patient drinks 1 cup of coffee a day. She walks 2-3 miles, goes to the gym or does yoga daily.    Allergies  Allergen Reactions  . Lactose Intolerance (Gi)    Family History  Problem Relation Age of Onset  . Depression Mother   . Hypertension Father   . Diabetes Father     Current Outpatient Medications (Endocrine & Metabolic):  .  progesterone (PROMETRIUM) 100 MG capsule, Take 300 mg by mouth daily. .  THYROID PO, Take by mouth.    Current Outpatient Medications (Analgesics):  Marland Kitchen  Ibuprofen-Famotidine (DUEXIS) 800-26.6 MG TABS, Take 800 mg by mouth 3 (three) times daily. .  SUMAtriptan (IMITREX) 50 MG tablet, Take 50 mg by mouth as directed.   Current Outpatient Medications (Other):  Marland Kitchen  Cholecalciferol (VITAMIN D3) 1000 units CAPS, Take 1 capsule by mouth daily. .  Menaquinone-7 (VITAMIN K2) 100 MCG CAPS, Take 1 capsule by mouth daily. .  Multiple Vitamin (MULTIVITAMIN) tablet, Take 1 tablet by mouth daily. .  Probiotic Product (PROBIOTIC-10 PO), Take 1 capsule by mouth daily. .  riboflavin (VITAMIN B-2) 100 MG TABS tablet, Take 100 mg by  mouth daily.    Past medical history, social, surgical and family history all reviewed in electronic medical record.  No pertanent information unless stated regarding to the chief complaint.   Review of Systems:  No  visual changes, nausea, vomiting, diarrhea, constipation, dizziness, abdominal pain, skin rash, fevers, chills, night sweats, weight loss, swollen lymph nodes, body aches, joint swelling, , chest pain, shortness of breath, mood changes.  Positive headache and muscle aches  Objective  Blood pressure 130/90, pulse 73, height 5' 3.5" (1.613 m), weight 138 lb (62.6 kg), SpO2 98 %.   General: No apparent distress alert and oriented x3 mood and affect normal, dressed appropriately.  HEENT: Pupils equal, extraocular movements intact  Respiratory: Patient's speak in full sentences and  does not appear short of breath  Cardiovascular: No lower extremity edema, non tender, no erythema  Skin: Warm dry intact with no signs of infection or rash on extremities or on axial skeleton.  Abdomen: Soft nontender  Neuro: Cranial nerves II through XII are intact, neurovascularly intact in all extremities with 2+ DTRs and 2+ pulses.  Lymph: No lymphadenopathy of posterior or anterior cervical chain or axillae bilaterally.  Gait normal with good balance and coordination.  MSK:  Non tender with full range of motion and good stability and symmetric strength and tone of shoulders, elbows, wrist, hip, knee and ankles bilaterally.  Neck: Inspection mild loss of lordosis. No palpable stepoffs. Negative Spurling's maneuver. Full neck range of motion Grip strength and sensation normal in bilateral hands Strength good C4 to T1 distribution No sensory change to C4 to T1 Negative Hoffman sign bilaterally Reflexes normal Tightness of the left trapezius  Osteopathic findings  C3 flexed rotated and side bent left T3 extended rotated and side bent left inhaled third rib     Impression and Recommendations:     This case required medical decision making of moderate complexity. The above documentation has been reviewed and is accurate and complete Judi Saa, DO       Note: This dictation was prepared with Dragon dictation along with smaller phrase technology. Any transcriptional errors that result from this process are unintentional.

## 2018-10-11 NOTE — Assessment & Plan Note (Signed)
Patient has been doing relatively well overall.  Encourage patient continue to work on [employer].  We discussed icing regimen and home exercise.  Discussed which activities to do which was to avoid.  Patient is to increase activity slowly over the course the next several days.  Follow-up with me again in 4 to 8 weeks

## 2018-10-11 NOTE — Assessment & Plan Note (Signed)
Decision today to treat with OMT was based on Physical Exam  After verbal consent patient was treated with HVLA, ME, FPR techniques in cervical, thoracic, rib areas  Patient tolerated the procedure well with improvement in symptoms  Patient given exercises, stretches and lifestyle modifications  See medications in patient instructions if given  Patient will follow up in 6-8 weeks 

## 2018-10-11 NOTE — Patient Instructions (Signed)
Great to see you  Madison [name] is your friend  You know the drill and keep working on the posture See me again in 6-8 weeks

## 2018-10-22 DIAGNOSIS — G43111 Migraine with aura, intractable, with status migrainosus: Secondary | ICD-10-CM | POA: Diagnosis not present

## 2018-10-22 DIAGNOSIS — M542 Cervicalgia: Secondary | ICD-10-CM | POA: Diagnosis not present

## 2018-10-29 DIAGNOSIS — G43111 Migraine with aura, intractable, with status migrainosus: Secondary | ICD-10-CM | POA: Diagnosis not present

## 2018-10-29 DIAGNOSIS — M542 Cervicalgia: Secondary | ICD-10-CM | POA: Diagnosis not present

## 2018-11-22 ENCOUNTER — Ambulatory Visit: Payer: BLUE CROSS/BLUE SHIELD | Admitting: Family Medicine

## 2018-12-17 DIAGNOSIS — M542 Cervicalgia: Secondary | ICD-10-CM | POA: Diagnosis not present

## 2018-12-17 DIAGNOSIS — G43111 Migraine with aura, intractable, with status migrainosus: Secondary | ICD-10-CM | POA: Diagnosis not present

## 2018-12-20 ENCOUNTER — Encounter: Payer: Self-pay | Admitting: Family Medicine

## 2018-12-20 ENCOUNTER — Ambulatory Visit (INDEPENDENT_AMBULATORY_CARE_PROVIDER_SITE_OTHER): Payer: BLUE CROSS/BLUE SHIELD | Admitting: Family Medicine

## 2018-12-20 ENCOUNTER — Other Ambulatory Visit: Payer: Self-pay

## 2018-12-20 VITALS — BP 120/72 | HR 85 | Ht 63.5 in | Wt 142.0 lb

## 2018-12-20 DIAGNOSIS — M25519 Pain in unspecified shoulder: Secondary | ICD-10-CM | POA: Diagnosis not present

## 2018-12-20 DIAGNOSIS — M2550 Pain in unspecified joint: Secondary | ICD-10-CM

## 2018-12-20 DIAGNOSIS — G43511 Persistent migraine aura without cerebral infarction, intractable, with status migrainosus: Secondary | ICD-10-CM

## 2018-12-20 MED ORDER — HYDROXYZINE HCL 10 MG PO TABS: 10.0000 mg | ORAL_TABLET | Freq: Three times a day (TID) | ORAL | 0 refills | Status: DC | PRN

## 2018-12-20 MED ORDER — KETOROLAC TROMETHAMINE 60 MG/2ML IM SOLN
60.0000 mg | Freq: Once | INTRAMUSCULAR | Status: AC
  Administered 2018-12-20: 60 mg via INTRAMUSCULAR

## 2018-12-20 NOTE — Assessment & Plan Note (Signed)
Patient was given injections bilaterally.  Discussed which activities of doing which will still avoid.  Discussed topical anti-inflammatories again.  Hydroxyzine given.  Hopefully patient will do well.  Worsening symptoms she is to call immediately.  May need laboratory work-up.

## 2018-12-20 NOTE — Patient Instructions (Addendum)
Good to see you  Madison [name] is your friend Stay active when you can  Trigger point injections and Toradol  Hydroxyzine up to 3 times a day  See me again in 3 weeks

## 2018-12-20 NOTE — Assessment & Plan Note (Signed)
Patient given a migraine cocktail today that included Toradol as well as trigger point injections.  Has responded to that before.  Has had side effects of unfortunately some nausea previously.  Patient has some medicine for nausea at home.  Hydroxyzine given for stress as well as some muscle relaxation if needed.  Patient will try to make these changes and see me again in  3 weeks

## 2018-12-20 NOTE — Progress Notes (Signed)
[name] [address] [place], [place] [address] Phone: [phone] Subjective:   Bruce Donath, am serving as a scribe for Dr. Antoine Primas.  I'm seeing this patient by the request  of:    CC:   [id number]  Madison [name] is a 48 y.o. female coming in with complaint of neck pain. Patient has been having pain in both traps and into her cervical spine. Patient is having migraines since last week on and off. Patient also complains of pain in the back of her eyes. Has been using migraine medication and Duexis.      Past Medical History:  Diagnosis Date  . Migraine    Past Surgical History:  Procedure Laterality Date  . BREAST SURGERY    . OTHER SURGICAL HISTORY     Various reconstruction from accident   Social History   Socioeconomic History  . Marital status: Married    Spouse name: Greggory Stallion "Bud"  . Number of children: 2  . Years of education: Not on file  . Highest education level: Bachelor's degree (e.g., BA, AB, BS)  Occupational History  . 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 Financial resource strain: Not on file  . Food insecurity:    Worry: Not on file    Inability: Not on file  . Transportation needs:    Medical: Not on file    Non-medical: Not on file  Tobacco Use  . Smoking status: Never Smoker  . Smokeless tobacco: Never Used  Substance and Sexual Activity  . Alcohol use: No  . Drug use: No  . Sexual activity: Not on file  Lifestyle  . Physical activity:    Days per week: Not on file    Minutes per session: Not on file  . Stress: Not on file  Relationships  . Social connections:    Talks on phone: Not on file    Gets together: Not on file    Attends religious service: Not on file    Active member of club or organization: Not on file    Attends meetings of clubs or organizations: Not on file    Relationship status: Not on file  Other Topics Concern  . Not on  file  Social History Narrative   Patient is right-handed. She lives with her husband and 2 teenagers in a 2 story house. They have one dog. Patient drinks 1 cup of coffee a day. She walks 2-3 miles, goes to the gym or does yoga daily.    Allergies  Allergen Reactions  . Lactose Intolerance (Gi)    Family History  Problem Relation Age of Onset  . Depression Mother   . Hypertension Father   . Diabetes Father     Current Outpatient Medications (Endocrine & Metabolic):  .  progesterone (PROMETRIUM) 100 MG capsule, Take 300 mg by mouth daily. .  THYROID PO, Take by mouth.    Current Outpatient Medications (Analgesics):  Marland Kitchen  Ibuprofen-Famotidine (DUEXIS) 800-26.6 MG TABS, Take 800 mg by mouth 3 (three) times daily. .  SUMAtriptan (IMITREX) 50 MG tablet, Take 50 mg by mouth as directed.   Current Outpatient Medications (Other):  Marland Kitchen  Cholecalciferol (VITAMIN D3) 1000 units CAPS, Take 1 capsule by mouth daily. .  Menaquinone-7 (VITAMIN K2) 100 MCG CAPS, Take 1 capsule by mouth daily. .  Multiple Vitamin (MULTIVITAMIN) tablet, Take 1 tablet by mouth daily. .  Probiotic Product (PROBIOTIC-10 PO),  Take 1 capsule by mouth daily. .  riboflavin (VITAMIN B-2) 100 MG TABS tablet, Take 100 mg by mouth daily. .  hydrOXYzine (ATARAX/VISTARIL) 10 MG tablet, Take 1 tablet (10 mg total) by mouth 3 (three) times daily as needed.    Past medical history, social, surgical and family history all reviewed in electronic medical record.  No pertanent information unless stated regarding to the chief complaint.   Review of Systems:  No headache, visual changes, nausea, vomiting, diarrhea, constipation, dizziness, abdominal pain, skin rash, fevers, chills, night sweats, weight loss, swollen lymph nodes, body aches, joint swelling, chest pain, shortness of breath, mood changes. Positive muscle aches   Objective  Blood pressure 120/72, pulse 85, height 5' 3.5" (1.613 m), weight 142 lb (64.4 kg), SpO2 98 %.     General: No apparent distress alert and oriented x3 mood and affect normal, dressed appropriately.  HEENT: Pupils equal, extraocular movements intact  Respiratory: Patient's speak in full sentences and does not appear short of breath  Cardiovascular: No lower extremity edema, non tender, no erythema  Skin: Warm dry intact with no signs of infection or rash on extremities or on axial skeleton.  Abdomen: Soft nontender  Neuro: Cranial nerves II through XII are intact, neurovascularly intact in all extremities with 2+ DTRs and 2+ pulses.  Lymph: No lymphadenopathy of posterior or anterior cervical chain or axillae bilaterally.  Gait normal with good balance and coordination.  MSK:  Non tender with full range of motion and good stability and symmetric strength and tone of shoulders, elbows, wrist, hip, knee and ankles bilaterally.  Neck: Inspection loss of lordosis. No palpable stepoffs. Negative Spurling's maneuver. Full neck range of motion Grip strength and sensation normal in bilateral hands Strength good C4 to T1 distribution No sensory change to C4 to T1 Negative Hoffman sign bilaterally Reflexes normal  Tightness of the trapezius muscles bilaterally.  Multiple trigger points bilaterally.  2 significant ones on the left and 2 significant ones on the right.  After verbal consent patient was prepped with alcohol swabs and with a 25-gauge half inch needle injected with 5 cc of 0.5% Marcaine and 0.5 cc of Kenalog 40 mg/mL no blood loss.  This was sent for distinct trigger points.  Shoulder region.  Band-Aids placed.  Postinjection instructions given.   Impression and Recommendations:     This case required medical decision making of moderate complexity. The above documentation has been reviewed and is accurate and complete Judi Saa, DO       Note: This dictation was prepared with Dragon dictation along with smaller phrase technology. Any transcriptional errors that result from  this process are unintentional.

## 2019-01-10 ENCOUNTER — Ambulatory Visit: Payer: BLUE CROSS/BLUE SHIELD | Admitting: Family Medicine

## 2019-03-29 DIAGNOSIS — F4322 Adjustment disorder with anxiety: Secondary | ICD-10-CM | POA: Diagnosis not present

## 2019-04-14 IMAGING — DX DG CERVICAL SPINE 2 OR 3 VIEWS
3 series · 3 of 3 positions shown · non-contrast
Comparison: None.

CLINICAL DATA: Chronic bilateral neck pain that radiates between
the scapulae.

EXAM:
CERVICAL SPINE - 2-3 VIEW

[c-spine lat]
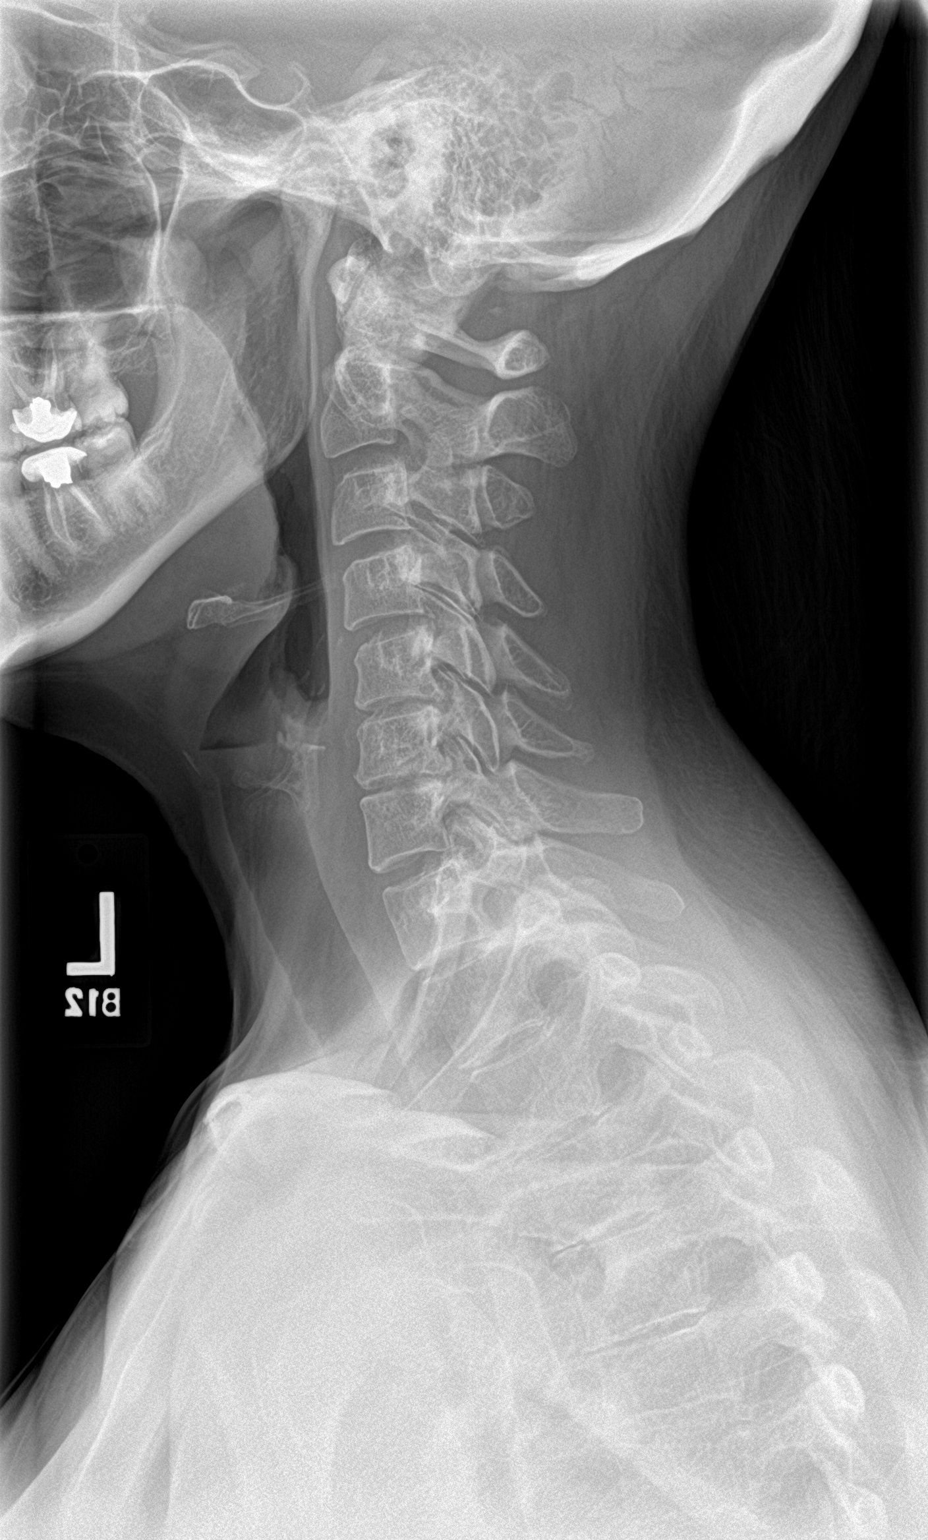

[c-spine ap]
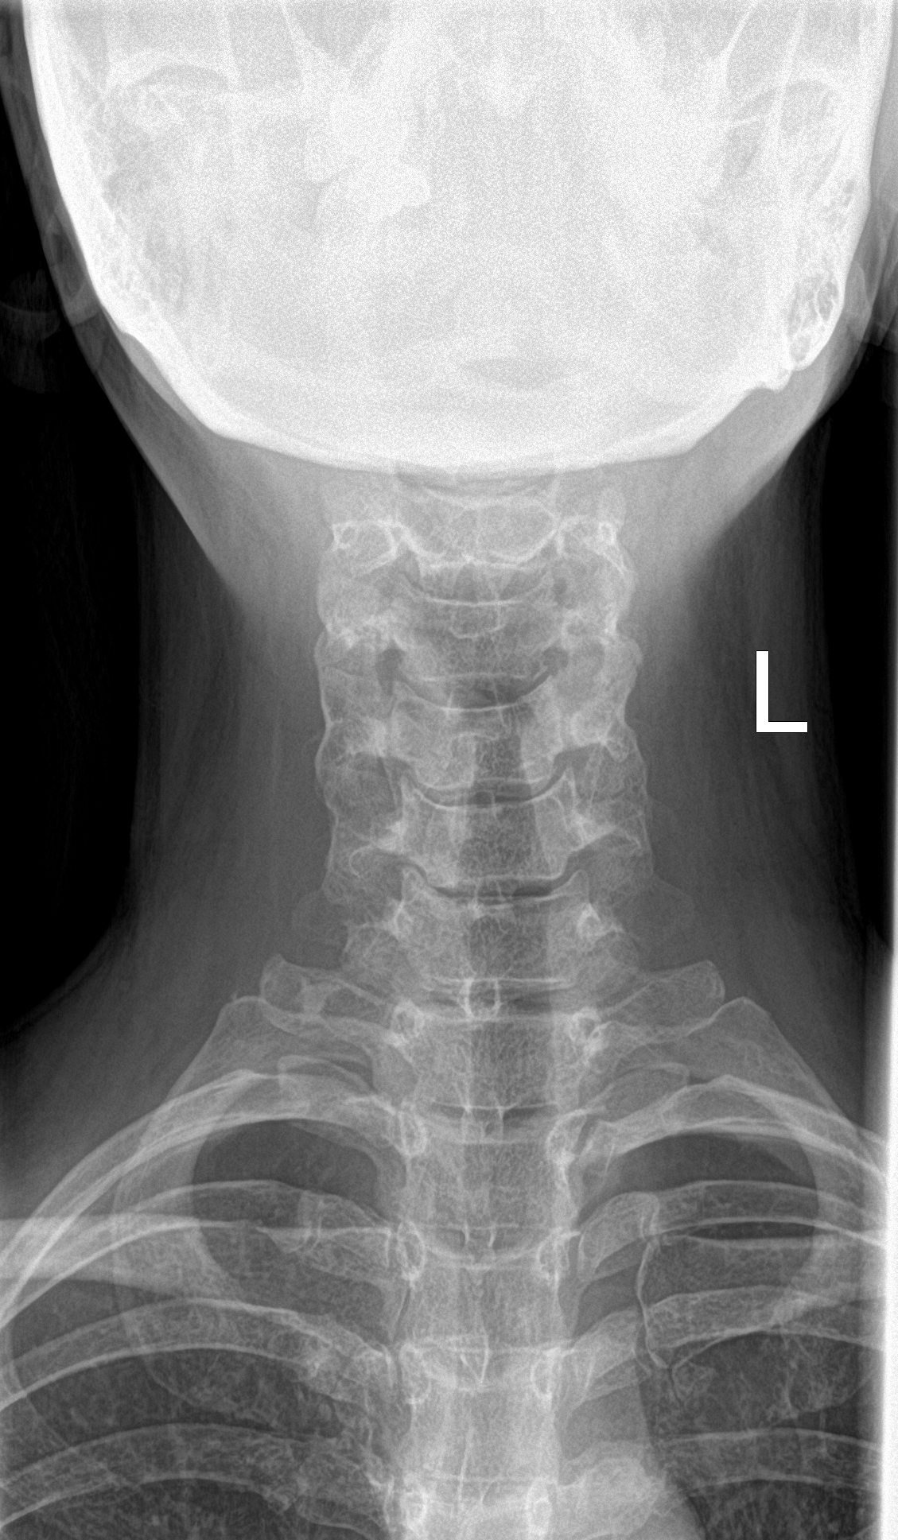

[c-spine open mouth]
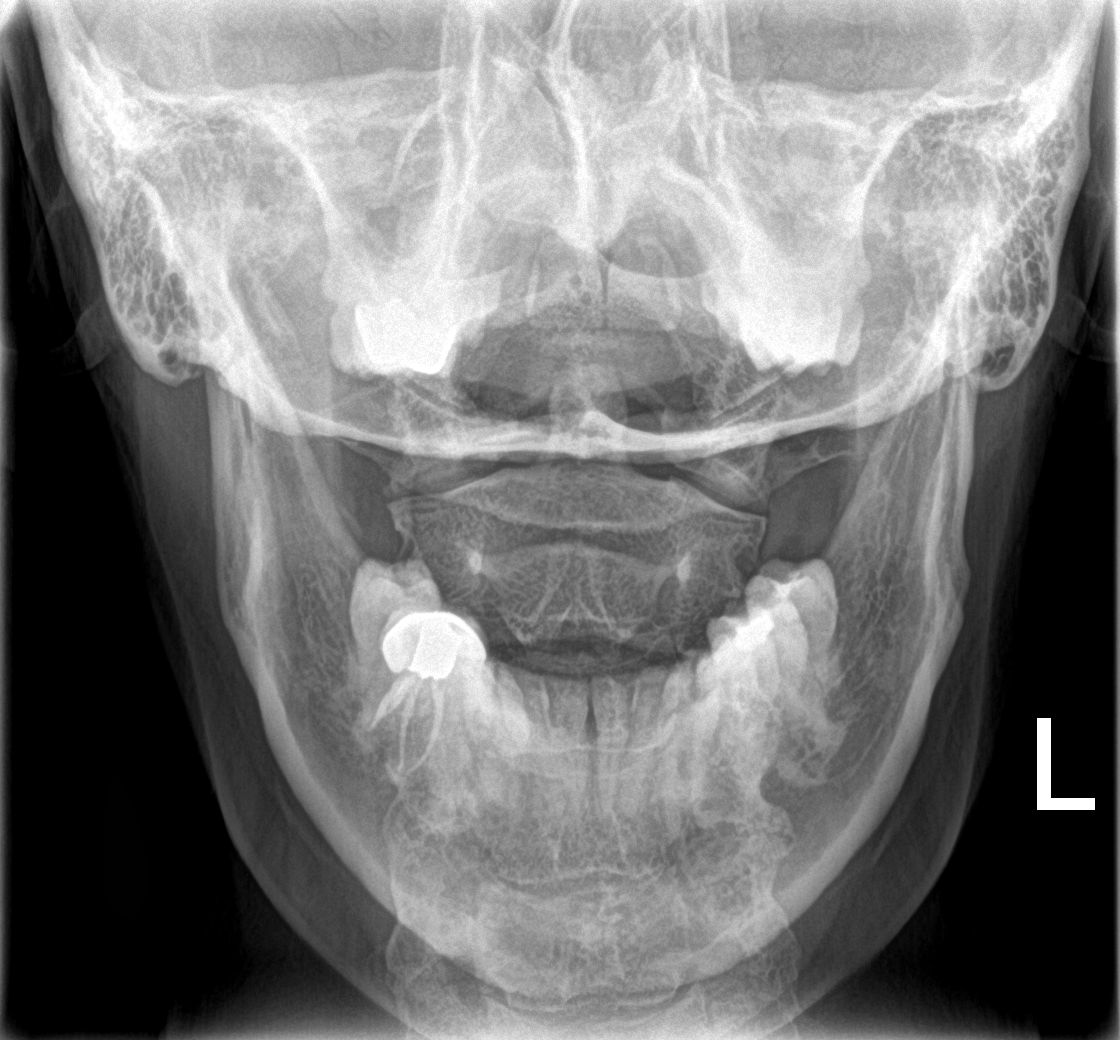

[3 of 3 positions shown; findings below may reference images not displayed]

FINDINGS: There is slight narrowing of the C5-6 and C6-7 disc spaces.
Prevertebral soft tissues are normal. Slight uncinate spurring to
the right at C4-5, C5-6, and C6-7. No appreciable facet arthritis.
IMPRESSION: Slight degenerative disc disease at  C5-6 and C6-7.

## 2019-04-15 ENCOUNTER — Telehealth: Payer: Self-pay

## 2019-04-15 NOTE — Telephone Encounter (Signed)
Copied from [place] [id number]. Topic: Appointment Scheduling - Scheduling Inquiry for Clinic >> Apr 12, 2019 11:38 AM Celene Kras A wrote: Reason for CRM: Pt called and is requesting to set up an appt to get her neck and shoulders adjusted with Dr. Tamala Julian. Please advise.

## 2019-04-15 NOTE — Telephone Encounter (Signed)
Patient is scheduled   

## 2019-04-17 DIAGNOSIS — F411 Generalized anxiety disorder: Secondary | ICD-10-CM | POA: Diagnosis not present

## 2019-04-17 DIAGNOSIS — R51 Headache: Secondary | ICD-10-CM | POA: Diagnosis not present

## 2019-04-17 DIAGNOSIS — N943 Premenstrual tension syndrome: Secondary | ICD-10-CM | POA: Diagnosis not present

## 2019-04-17 DIAGNOSIS — E7210 Disorders of sulfur-bearing amino-acid metabolism, unspecified: Secondary | ICD-10-CM | POA: Diagnosis not present

## 2019-04-23 DIAGNOSIS — R5383 Other fatigue: Secondary | ICD-10-CM | POA: Diagnosis not present

## 2019-04-23 DIAGNOSIS — R51 Headache: Secondary | ICD-10-CM | POA: Diagnosis not present

## 2019-04-23 DIAGNOSIS — E7210 Disorders of sulfur-bearing amino-acid metabolism, unspecified: Secondary | ICD-10-CM | POA: Diagnosis not present

## 2019-04-23 DIAGNOSIS — R5381 Other malaise: Secondary | ICD-10-CM | POA: Diagnosis not present

## 2019-05-13 NOTE — Progress Notes (Signed)
Madison ScaleZach Wanona [name] D.O. [facility] [address] [place], [place] [address] Phone: [phone] Subjective:   I Madison NighKana [name] am serving as a Neurosurgeonscribe for Dr. Antoine PrimasZachary Daris [name].   CC: Neck and back pain follow-up  [id number]:Subjective  Madison OrleansMelanie [name] is a 48 y.o. female coming in with complaint of back pain. States that she had a migraine Sunday and yesterday. Feels like there is tightness in her neck, hips and shoulders.      Past Medical History:  Diagnosis Date  . Migraine    Past Surgical History:  Procedure Laterality Date  . BREAST SURGERY    . OTHER SURGICAL HISTORY     Various reconstruction from accident   Social History   Socioeconomic History  . Marital status: Married    Spouse name: Madison StallionGeorge "Bud"  . Number of children: 2  . Years of education: Not on file  . Highest education level: Bachelor's degree (e.g., BA, AB, BS)  Occupational History  . 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 Financial resource strain: Not on file  . Food insecurity    Worry: Not on file    Inability: Not on file  . Transportation needs    Medical: Not on file    Non-medical: Not on file  Tobacco Use  . Smoking status: Never Smoker  . Smokeless tobacco: Never Used  Substance and Sexual Activity  . Alcohol use: No  . Drug use: No  . Sexual activity: Not on file  Lifestyle  . Physical activity    Days per week: Not on file    Minutes per session: Not on file  . Stress: Not on file  Relationships  . Social Musicianconnections    Talks on phone: Not on file    Gets together: Not on file    Attends religious service: Not on file    Active member of club or organization: Not on file    Attends meetings of clubs or organizations: Not on file    Relationship status: Not on file  Other Topics Concern  . Not on file  Social History Narrative   Patient is right-handed. She lives with her husband and 2 teenagers in a 2 story house. They have one  dog. Patient drinks 1 cup of coffee a day. She walks 2-3 miles, goes to the gym or does yoga daily.    Allergies  Allergen Reactions  . Lactose Intolerance (Gi)    Family History  Problem Relation Age of Onset  . Depression Mother   . Hypertension Father   . Diabetes Father     Current Outpatient Medications (Endocrine & Metabolic):  .  progesterone (PROMETRIUM) 100 MG capsule, Take 300 mg by mouth daily. .  THYROID PO, Take by mouth.    Current Outpatient Medications (Analgesics):  Marland Kitchen.  SUMAtriptan (IMITREX) 50 MG tablet, Take 50 mg by mouth as directed. .  Ibuprofen-Famotidine (DUEXIS) 800-26.6 MG TABS, Take 800 mg by mouth 3 (three) times daily.   Current Outpatient Medications (Other):  Marland Kitchen.  Cholecalciferol (VITAMIN D3) 1000 units CAPS, Take 1 capsule by mouth daily. .  hydrOXYzine (ATARAX/VISTARIL) 10 MG tablet, Take 1 tablet (10 mg total) by mouth 3 (three) times daily as needed. .  Menaquinone-7 (VITAMIN K2) 100 MCG CAPS, Take 1 capsule by mouth daily. .  Multiple Vitamin (MULTIVITAMIN) tablet, Take 1 tablet by mouth daily. .  Probiotic Product (PROBIOTIC-10 PO), Take 1 capsule by mouth daily. .Marland Kitchen  riboflavin (VITAMIN B-2) 100 MG TABS tablet, Take 100 mg by mouth daily.    Past medical history, social, surgical and family history all reviewed in electronic medical record.  No pertanent information unless stated regarding to the chief complaint.   Review of Systems:  No headache, visual changes, nausea, vomiting, diarrhea, constipation, dizziness, abdominal pain, skin rash, fevers, chills, night sweats, weight loss, swollen lymph nodes, body aches, joint swelling, muscle aches, chest pain, shortness of breath, mood changes.   Objective  Blood pressure 102/80, pulse 74, height 5' 3.5" (1.613 m), weight 135 lb (61.2 kg), SpO2 98 %.    General: No apparent distress alert and oriented x3 mood and affect normal, dressed appropriately.  HEENT: Pupils equal, extraocular movements  intact  Respiratory: Patient's speak in full sentences and does not appear short of breath  Cardiovascular: No lower extremity edema, non tender, no erythema  Skin: Warm dry intact with no signs of infection or rash on extremities or on axial skeleton.  Abdomen: Soft nontender  Neuro: Cranial nerves II through XII are intact, neurovascularly intact in all extremities with 2+ DTRs and 2+ pulses.  Lymph: No lymphadenopathy of posterior or anterior cervical chain or axillae bilaterally.  Gait normal with good balance and coordination.  MSK:  Non tender with full range of motion and good stability and symmetric strength and tone of shoulders, elbows, wrist, hip, knee and ankles bilaterally.  Neck: Inspection mild loss of lordosis. No palpable stepoffs. Negative Spurling's maneuver. Mild limitation with sidebending bilaterally right greater than left Grip strength and sensation normal in bilateral hands Strength good C4 to T1 distribution No sensory change to C4 to T1 Negative Hoffman sign bilaterally Reflexes normal  Back Exam:  Inspection: Tightness in the thoracolumbar juncture Motion: Flexion 45 deg, Extension 35 deg, Side Bending to 45 deg bilaterally,  Rotation to 45 deg bilaterally  SLR laying: Negative  XSLR laying: Negative  Palpable tenderness: Tender to palpation along the thoracolumbar juncture. FABER: Positive right. Sensory change: Gross sensation intact to all lumbar and sacral dermatomes.  Reflexes: 2+ at both patellar tendons, 2+ at achilles tendons, Babinski's downgoing.  Strength at foot  Plantar-flexion: 5/5 Dorsi-flexion: 5/5 Eversion: 5/5 Inversion: 5/5  Leg strength  Quad: 5/5 Hamstring: 5/5 Hip flexor: 5/5 Hip abductors: 5/5  Gait unremarkable.  Osteopathic findings C2 flexed rotated and side bent left C6 flexed rotated and side bent left T3 extended rotated and side bent right inhaled third rib T7 extended rotated and side bent left L2 flexed rotated and  side bent right Sacrum right on right    Impression and Recommendations:     This case required medical decision making of moderate complexity. The above documentation has been reviewed and is accurate and complete Lyndal Pulley, DO       Note: This dictation was prepared with Dragon dictation along with smaller phrase technology. Any transcriptional errors that result from this process are unintentional.

## 2019-05-14 ENCOUNTER — Encounter: Payer: Self-pay | Admitting: Family Medicine

## 2019-05-14 ENCOUNTER — Other Ambulatory Visit: Payer: Self-pay

## 2019-05-14 ENCOUNTER — Ambulatory Visit: Payer: BLUE CROSS/BLUE SHIELD | Admitting: Family Medicine

## 2019-05-14 VITALS — BP 102/80 | HR 74 | Ht 63.5 in | Wt 135.0 lb

## 2019-05-14 DIAGNOSIS — H524 Presbyopia: Secondary | ICD-10-CM | POA: Diagnosis not present

## 2019-05-14 DIAGNOSIS — M999 Biomechanical lesion, unspecified: Secondary | ICD-10-CM | POA: Diagnosis not present

## 2019-05-14 DIAGNOSIS — R51 Headache: Secondary | ICD-10-CM | POA: Diagnosis not present

## 2019-05-14 DIAGNOSIS — G4486 Cervicogenic headache: Secondary | ICD-10-CM

## 2019-05-14 MED ORDER — DUEXIS 800-26.6 MG PO TABS: 800.0000 mg | ORAL_TABLET | Freq: Three times a day (TID) | ORAL | 3 refills | Status: DC

## 2019-05-14 NOTE — Patient Instructions (Signed)
Keep up exercises Keep monitor at eye level Thank you for the kind words! See [place] again in 6-7 weeks

## 2019-05-14 NOTE — Assessment & Plan Note (Signed)
Patient is to continue home exercises, posture and ergonomics, which activities to do.  We discussed different ergonomic changes at work including getting monitored high-level.  Follow-up again in 4 to 8 weeks

## 2019-06-18 DIAGNOSIS — D509 Iron deficiency anemia, unspecified: Secondary | ICD-10-CM | POA: Diagnosis not present

## 2019-06-18 DIAGNOSIS — R5383 Other fatigue: Secondary | ICD-10-CM | POA: Diagnosis not present

## 2019-07-03 ENCOUNTER — Ambulatory Visit: Payer: BC Managed Care – PPO | Admitting: Family Medicine

## 2019-07-11 DIAGNOSIS — N943 Premenstrual tension syndrome: Secondary | ICD-10-CM | POA: Diagnosis not present

## 2019-07-11 DIAGNOSIS — R5381 Other malaise: Secondary | ICD-10-CM | POA: Diagnosis not present

## 2019-07-11 DIAGNOSIS — R5383 Other fatigue: Secondary | ICD-10-CM | POA: Diagnosis not present

## 2019-07-11 DIAGNOSIS — E7210 Disorders of sulfur-bearing amino-acid metabolism, unspecified: Secondary | ICD-10-CM | POA: Diagnosis not present

## 2019-07-11 DIAGNOSIS — R519 Headache, unspecified: Secondary | ICD-10-CM | POA: Diagnosis not present

## 2019-07-17 DIAGNOSIS — H5032 Intermittent alternating esotropia: Secondary | ICD-10-CM | POA: Diagnosis not present

## 2019-07-24 ENCOUNTER — Ambulatory Visit: Payer: BC Managed Care – PPO | Admitting: Family Medicine

## 2019-07-24 ENCOUNTER — Encounter: Payer: Self-pay | Admitting: Family Medicine

## 2019-07-24 ENCOUNTER — Other Ambulatory Visit: Payer: Self-pay

## 2019-07-24 VITALS — BP 102/62 | HR 78 | Ht 63.5 in | Wt 139.0 lb

## 2019-07-24 DIAGNOSIS — R519 Headache, unspecified: Secondary | ICD-10-CM

## 2019-07-24 DIAGNOSIS — G4486 Cervicogenic headache: Secondary | ICD-10-CM

## 2019-07-24 DIAGNOSIS — M999 Biomechanical lesion, unspecified: Secondary | ICD-10-CM

## 2019-07-24 MED ORDER — DUEXIS 800-26.6 MG PO TABS: 800.0000 mg | ORAL_TABLET | Freq: Three times a day (TID) | ORAL | 3 refills | Status: DC

## 2019-07-24 MED ORDER — KETOROLAC TROMETHAMINE 60 MG/2ML IM SOLN
60.0000 mg | Freq: Once | INTRAMUSCULAR | Status: AC
  Administered 2019-07-24: 60 mg via INTRAMUSCULAR

## 2019-07-24 NOTE — Progress Notes (Signed)
[facility] [address] [place], [place] [address] Phone: [phone] Subjective:   I [name] am serving as a Education administrator for Dr. Hulan Saas.    CC: Neck pain follow-up  [id number]  Madison [name] is a 48 y.o. female coming in with complaint of neck pain. States lately she has been dealing with a lot of headaches.  Patient has a history of migraines. Patient has been doing relatively well overall but but recently has had increasing headaches.  Feels like a migraine is coming on.  Has had to take Imitrex 2 times in the last week secondary to the pain.      Past Medical History:  Diagnosis Date  . Migraine    Past Surgical History:  Procedure Laterality Date  . BREAST SURGERY    . OTHER SURGICAL HISTORY     Various reconstruction from accident   Social History   Socioeconomic History  . Marital status: Married    Spouse name: Iona Beard "Bud"  . Number of children: 2  . Years of education: Not on file  . Highest education level: Bachelor's degree (e.g., BA, AB, BS)  Occupational History  . Occupation: Psychologist, clinical: Futures trader  Social Needs  . Financial resource strain: Not on file  . Food insecurity    Worry: Not on file    Inability: Not on file  . Transportation needs    Medical: Not on file    Non-medical: Not on file  Tobacco Use  . Smoking status: Never Smoker  . Smokeless tobacco: Never Used  Substance and Sexual Activity  . Alcohol use: No  . Drug use: No  . Sexual activity: Not on file  Lifestyle  . Physical activity    Days per week: Not on file    Minutes per session: Not on file  . Stress: Not on file  Relationships  . Social Herbalist on phone: Not on file    Gets together: Not on file    Attends religious service: Not on file    Active member of club or organization: Not on file    Attends meetings of clubs or organizations: Not on file    Relationship status: Not on  file  Other Topics Concern  . Not on file  Social History Narrative   Patient is right-handed. She lives with her husband and 2 teenagers in a 2 story house. They have one dog. Patient drinks 1 cup of coffee a day. She walks 2-3 miles, goes to the gym or does yoga daily.    Allergies  Allergen Reactions  . Lactose Intolerance (Gi)    Family History  Problem Relation Age of Onset  . Depression Mother   . Hypertension Father   . Diabetes Father     Current Outpatient Medications (Endocrine & Metabolic):  .  progesterone (PROMETRIUM) 100 MG capsule, Take 300 mg by mouth daily. .  THYROID PO, Take by mouth.    Current Outpatient Medications (Analgesics):  Marland Kitchen  SUMAtriptan (IMITREX) 50 MG tablet, Take 50 mg by mouth as directed. .  Ibuprofen-Famotidine (DUEXIS) 800-26.6 MG TABS, Take 800 mg by mouth 3 (three) times daily.   Current Outpatient Medications (Other):  Marland Kitchen  Cholecalciferol (VITAMIN D3) 1000 units CAPS, Take 1 capsule by mouth daily. .  hydrOXYzine (ATARAX/VISTARIL) 10 MG tablet, Take 1 tablet (10 mg total) by mouth 3 (three) times daily as needed. .  Menaquinone-7 (VITAMIN  K2) 100 MCG CAPS, Take 1 capsule by mouth daily. .  Multiple Vitamin (MULTIVITAMIN) tablet, Take 1 tablet by mouth daily. .  Probiotic Product (PROBIOTIC-10 PO), Take 1 capsule by mouth daily. .  riboflavin (VITAMIN B-2) 100 MG TABS tablet, Take 100 mg by mouth daily.    Past medical history, social, surgical and family history all reviewed in electronic medical record.  No pertanent information unless stated regarding to the chief complaint.   Review of Systems:  No  visual changes, nausea, vomiting, diarrhea, constipation, dizziness, abdominal pain, skin rash, fevers, chills, night sweats, weight loss, swollen lymph nodes, body aches, joint swelling, chest pain, shortness of breath, mood changes.  Positive headache and muscle aches  Objective  Blood pressure 102/62, pulse 78, height 5' 3.5" (1.613  m), weight 139 lb (63 kg), SpO2 98 %.   General: No apparent distress alert and oriented x3 mood and affect normal, dressed appropriately.  HEENT: Pupils equal, extraocular movements intact  Respiratory: Patient's speak in full sentences and does not appear short of breath  Cardiovascular: No lower extremity edema, non tender, no erythema  Skin: Warm dry intact with no signs of infection or rash on extremities or on axial skeleton.  Abdomen: Soft nontender  Neuro: Cranial nerves II through XII are intact, neurovascularly intact in all extremities with 2+ DTRs and 2+ pulses.  Lymph: No lymphadenopathy of posterior or anterior cervical chain or axillae bilaterally.  Gait normal with good balance and coordination.  MSK:  Non tender with full range of motion and good stability and symmetric strength and tone of shoulders, elbows, wrist, hip, knee and ankles bilaterally.  Neck exam shows mild loss of lordosis.  Significant tightness noted in the parascapular region on the left side.  Tightness of the left parascapular region as well as the left occipital region.  Osteopathic findings C2 flexed rotated and side bent right C6 flexed rotated and side bent left T3 extended rotated and side bent right inhaled third rib T7 extended rotated and side bent left     Impression and Recommendations:     This case required medical decision making of moderate complexity. The above documentation has been reviewed and is accurate and complete Judi Saa, DO       Note: This dictation was prepared with Dragon dictation along with smaller phrase technology. Any transcriptional errors that result from this process are unintentional.

## 2019-07-24 NOTE — Assessment & Plan Note (Signed)
Decision today to treat with OMT was based on Physical Exam  After verbal consent patient was treated with HVLA, ME, FPR techniques in cervical, thoracic, rib lumbar and sacral areas  Patient tolerated the procedure well with improvement in symptoms  Patient given exercises, stretches and lifestyle modifications  See medications in patient instructions if given  Patient will follow up in 4-6 weeks 

## 2019-07-24 NOTE — Patient Instructions (Signed)
See me again in 5-6 weeks 

## 2019-07-24 NOTE — Assessment & Plan Note (Signed)
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  Discussed with patient about icing regimen and home exercises, discussed with activity which will avoid.  Discussed with patient about the possible exacerbation.  Toradol given today.  Patient has Duexis as well.  Follow-up with me again 4 to 6 weeks.

## 2019-07-24 NOTE — Addendum Note (Signed)
Addended by: Douglass Rivers T on: 07/24/2019 03:19 PM   Modules accepted: Orders

## 2019-07-30 DIAGNOSIS — R5383 Other fatigue: Secondary | ICD-10-CM | POA: Diagnosis not present

## 2019-07-31 DIAGNOSIS — R5383 Other fatigue: Secondary | ICD-10-CM | POA: Diagnosis not present

## 2019-07-31 DIAGNOSIS — E039 Hypothyroidism, unspecified: Secondary | ICD-10-CM | POA: Diagnosis not present

## 2019-09-04 ENCOUNTER — Ambulatory Visit (INDEPENDENT_AMBULATORY_CARE_PROVIDER_SITE_OTHER): Payer: BLUE CROSS/BLUE SHIELD | Admitting: Family Medicine

## 2019-09-04 ENCOUNTER — Encounter: Payer: Self-pay | Admitting: Family Medicine

## 2019-09-04 VITALS — BP 122/84 | HR 75 | Ht 63.5 in | Wt 139.0 lb

## 2019-09-04 DIAGNOSIS — R519 Headache, unspecified: Secondary | ICD-10-CM | POA: Diagnosis not present

## 2019-09-04 DIAGNOSIS — G4486 Cervicogenic headache: Secondary | ICD-10-CM

## 2019-09-04 DIAGNOSIS — M999 Biomechanical lesion, unspecified: Secondary | ICD-10-CM

## 2019-09-04 MED ORDER — DUEXIS 800-26.6 MG PO TABS: 1.0000 | ORAL_TABLET | Freq: Three times a day (TID) | ORAL | 3 refills | Status: DC | PRN

## 2019-09-04 NOTE — Patient Instructions (Addendum)
  [address]st floor [place], [place] [address] Phone [phone]  Good to see you Cervical traction hammock  Yoga wheel See me again in 5 weeks

## 2019-09-04 NOTE — Assessment & Plan Note (Signed)
Decision today to treat with OMT was based on Physical Exam  After verbal consent patient was treated with HVLA, ME, FPR techniques in cervical, thoracic, rib l areas  Patient tolerated the procedure well with improvement in symptoms  Patient given exercises, stretches and lifestyle modifications  See medications in patient instructions if given  Patient will follow up in 4-8 weeks 

## 2019-09-04 NOTE — Progress Notes (Signed)
[facility] [place],  [address] Phone: [phone] Subjective:   Madison [name], am serving as a scribe for Dr. Hulan Saas. This visit occurred during the SARS-CoV-2 public health emergency.  Safety protocols were in place, including screening questions prior to the visit, additional usage of staff PPE, and extensive cleaning of exam room while observing appropriate contact time as indicated for disinfecting solutions.    CC: Neck pain, cervicogenic headache follow-up  [id number]  Madison [name] is a 48 y.o. female coming in with complaint of neck pain. Last seen on 07/24/2019. Patient states that she has been having migraines in the past week. Is having neck tightness today. Attributes tightness to posture.      Past Medical History:  Diagnosis Date  . Migraine    Past Surgical History:  Procedure Laterality Date  . BREAST SURGERY    . OTHER SURGICAL HISTORY     Various reconstruction from accident   Social History   Socioeconomic History  . Marital status: Married    Spouse name: Madison [name] "Bud"  . Number of children: 2  . Years of education: Not on file  . Highest education level: Bachelor's degree (e.g., BA, AB, BS)  Occupational History  . Occupation: interior Manufacturing engineer: Futures trader  Tobacco Use  . Smoking status: Never Smoker  . Smokeless tobacco: Never Used  Substance and Sexual Activity  . Alcohol use: [name]  . Drug use: [name]  . Sexual activity: Not on file  Other Topics Concern  . Not on file  Social History Narrative   Patient is right-handed. She lives with her husband and 2 teenagers in a 2 story house. They have one dog. Patient drinks 1 cup of coffee a day. She walks 2-3 miles, goes to the gym or does yoga daily.    Social Determinants of Health   Financial Resource Strain:   . Difficulty of Paying Living Expenses: Not on file  Food Insecurity:   . Worried About Sales executive in the Last Year: Not on file  . Ran Out of Food in the Last Year: Not on file  Transportation Needs:   . Lack of Transportation (Medical): Not on file  . Lack of Transportation (Non-Medical): Not on file  Physical Activity:   . Days of Exercise per Week: Not on file  . Minutes of Exercise per Session: Not on file  Stress:   . Feeling of Stress : Not on file  Social Connections:   . Frequency of Communication with Friends and Family: Not on file  . Frequency of Social Gatherings with Friends and Family: Not on file  . Attends Religious Services: Not on file  . Active Member of Clubs or Organizations: Not on file  . Attends Archivist Meetings: Not on file  . Marital Status: Not on file   Allergies  Allergen Reactions  . Lactose Intolerance (Gi)    Family History  Problem Relation Age of Onset  . Depression Mother   . Hypertension Father   . Diabetes Father     Current Outpatient Medications (Endocrine & Metabolic):  .  progesterone (PROMETRIUM) 100 MG capsule, Take 300 mg by mouth daily. .  THYROID PO, Take by mouth.    Current Outpatient Medications (Analgesics):  Marland Kitchen  Ibuprofen-Famotidine (DUEXIS) 800-26.6 MG TABS, Take 800 mg by mouth 3 (three) times daily. .  Ibuprofen-Famotidine (DUEXIS) 800-26.6 MG TABS, Take 1 tablet  by mouth 3 (three) times daily as needed. .  SUMAtriptan (IMITREX) 50 MG tablet, Take 50 mg by mouth as directed.   Current Outpatient Medications (Other):  Marland Kitchen  Cholecalciferol (VITAMIN D3) 1000 units CAPS, Take 1 capsule by mouth daily. .  hydrOXYzine (ATARAX/VISTARIL) 10 MG tablet, Take 1 tablet (10 mg total) by mouth 3 (three) times daily as needed. .  Menaquinone-7 (VITAMIN K2) 100 MCG CAPS, Take 1 capsule by mouth daily. .  Multiple Vitamin (MULTIVITAMIN) tablet, Take 1 tablet by mouth daily. .  Probiotic Product (PROBIOTIC-10 PO), Take 1 capsule by mouth daily. .  riboflavin (VITAMIN B-2) 100 MG TABS tablet, Take 100 mg by mouth  daily.    Past medical history, social, surgical and family history all reviewed in electronic medical record.  [name] pertanent information unless stated regarding to the chief complaint.   Review of Systems:  [name] headache, visual changes, nausea, vomiting, diarrhea, constipation, dizziness, abdominal pain, skin rash, fevers, chills, night sweats, weight loss, swollen lymph nodes, body aches, joint swelling, , chest pain, shortness of breath, mood changes.  Positive muscle aches  Objective  Blood pressure 122/84, pulse 75, height 5' 3.5" (1.613 m), weight 139 lb (63 kg), SpO2 99 %.    General: [name] apparent distress alert and oriented x3 mood and affect normal, dressed appropriately.  HEENT: Pupils equal, extraocular movements intact  Respiratory: Patient's speak in full sentences and does not appear short of breath  Cardiovascular: [name] lower extremity edema, non tender, [name] erythema  Skin: Warm dry intact with [name] signs of infection or rash on extremities or on axial skeleton.  Abdomen: Soft nontender  Neuro: Cranial nerves II through XII are intact, neurovascularly intact in all extremities with 2+ DTRs and 2+ pulses.  Lymph: [name] lymphadenopathy of posterior or anterior cervical chain or axillae bilaterally.  Gait normal with good balance and coordination.  MSK:  Non tender with full range of motion and good stability and symmetric strength and tone of shoulders, elbows, wrist, hip, knee and ankles bilaterally.  Neck: Inspection mild loss of lordosis. [name] palpable stepoffs. Negative Spurling's maneuver. Mild limited range of motion in sidebending and rotation. Grip strength and sensation normal in bilateral hands Strength good C4 to T1 distribution [name] sensory change to C4 to T1 Negative Hoffman sign bilaterally Reflexes normal Tightness noted in the trapezius bilaterally right greater than left  Osteopathic findings  C7 flexed rotated and side bent right T3 extended rotated and side bent  right inhaled third rib    Impression and Recommendations:     This case required medical decision making of moderate complexity. The above documentation has been reviewed and is accurate and complete Judi Saa, DO       Note: This dictation was prepared with Dragon dictation along with smaller phrase technology. Any transcriptional errors that result from this process are unintentional.

## 2019-09-04 NOTE — Assessment & Plan Note (Signed)
Patient has made good progress at the moment.  And is responding well to osteopathic manipulation, I am concerned that increasing duration between visits on could lose some ground that where it had been.  Patient will increase just icing regimen and home exercises, follow-up with me again in 4 to 8 weeks

## 2019-10-09 ENCOUNTER — Ambulatory Visit: Payer: Self-pay | Admitting: Family Medicine

## 2019-10-11 DIAGNOSIS — R519 Headache, unspecified: Secondary | ICD-10-CM | POA: Diagnosis not present

## 2019-10-11 DIAGNOSIS — R5383 Other fatigue: Secondary | ICD-10-CM | POA: Diagnosis not present

## 2019-10-11 DIAGNOSIS — E7210 Disorders of sulfur-bearing amino-acid metabolism, unspecified: Secondary | ICD-10-CM | POA: Diagnosis not present

## 2019-10-11 DIAGNOSIS — R5381 Other malaise: Secondary | ICD-10-CM | POA: Diagnosis not present

## 2019-12-05 DIAGNOSIS — R5383 Other fatigue: Secondary | ICD-10-CM | POA: Diagnosis not present

## 2019-12-05 DIAGNOSIS — E7210 Disorders of sulfur-bearing amino-acid metabolism, unspecified: Secondary | ICD-10-CM | POA: Diagnosis not present

## 2019-12-05 DIAGNOSIS — R519 Headache, unspecified: Secondary | ICD-10-CM | POA: Diagnosis not present

## 2019-12-05 DIAGNOSIS — R5381 Other malaise: Secondary | ICD-10-CM | POA: Diagnosis not present

## 2019-12-09 ENCOUNTER — Other Ambulatory Visit: Payer: Self-pay

## 2019-12-09 ENCOUNTER — Encounter: Payer: Self-pay | Admitting: Family Medicine

## 2019-12-09 ENCOUNTER — Ambulatory Visit (INDEPENDENT_AMBULATORY_CARE_PROVIDER_SITE_OTHER): Payer: BLUE CROSS/BLUE SHIELD | Admitting: Family Medicine

## 2019-12-09 VITALS — BP 136/80 | HR 74 | Ht 63.5 in | Wt 136.0 lb

## 2019-12-09 DIAGNOSIS — G4486 Cervicogenic headache: Secondary | ICD-10-CM

## 2019-12-09 DIAGNOSIS — M999 Biomechanical lesion, unspecified: Secondary | ICD-10-CM

## 2019-12-09 DIAGNOSIS — R519 Headache, unspecified: Secondary | ICD-10-CM

## 2019-12-09 MED ORDER — KETOROLAC TROMETHAMINE 60 MG/2ML IM SOLN
60.0000 mg | Freq: Once | INTRAMUSCULAR | Status: AC
  Administered 2019-12-09: 60 mg via INTRAMUSCULAR

## 2019-12-09 NOTE — Progress Notes (Signed)
[facility] [address] [place] Phone: [phone] Subjective:   I Madison [name] am serving as a Education administrator for Dr. Hulan Saas.  This visit occurred during the SARS-CoV-2 public health emergency.  Safety protocols were in place, including screening questions prior to the visit, additional usage of staff PPE, and extensive cleaning of exam room while observing appropriate contact time as indicated for disinfecting solutions.   I'm seeing this patient by the request  of:  [facility]  CC: Headaches and neck pain follow-up  [id number]  Madison [name] is a 49 y.o. female coming in with complaint of back pain. Last seen in 09/04/2019 for OMT. Patient states she is doing well. Left sided back pain and migraines last week. Took Duexis for pain.  Patient states that it seems to be more frequent though.  Patient does not know what exactly the trigger is.  Patient states when she takes anti-inflammatory has been helpful.  Has had recently a magnesium infusion with seem to be helpful as well.      Past Medical History:  Diagnosis Date  . Migraine    Past Surgical History:  Procedure Laterality Date  . BREAST SURGERY    . OTHER SURGICAL HISTORY     Various reconstruction from accident   Social History   Socioeconomic History  . Marital status: Married    Spouse name: Madison Beard "Bud"  . Number of children: 2  . Years of education: Not on file  . Highest education level: Bachelor's degree (e.g., BA, AB, BS)  Occupational History  . Occupation: interior Manufacturing engineer: Futures trader  Tobacco Use  . Smoking status: Never Smoker  . Smokeless tobacco: Never Used  Substance and Sexual Activity  . Alcohol use: No  . Drug use: No  . Sexual activity: Not on file  Other Topics Concern  . Not on file  Social History Narrative   Patient is right-handed. She lives with her husband and 2 teenagers in a 2  story house. They have one dog. Patient drinks 1 cup of coffee a day. She walks 2-3 miles, goes to the gym or does yoga daily.    Social Determinants of Health   Financial Resource Strain:   . Difficulty of Paying Living Expenses:   Food Insecurity:   . Worried About Charity fundraiser in the Last Year:   . Arboriculturist in the Last Year:   Transportation Needs:   . Film/video editor (Medical):   Marland Kitchen Lack of Transportation (Non-Medical):   Physical Activity:   . Days of Exercise per Week:   . Minutes of Exercise per Session:   Stress:   . Feeling of Stress :   Social Connections:   . Frequency of Communication with Friends and Family:   . Frequency of Social Gatherings with Friends and Family:   . Attends Religious Services:   . Active Member of Clubs or Organizations:   . Attends Archivist Meetings:   Marland Kitchen Marital Status:    Allergies  Allergen Reactions  . Lactose Intolerance (Gi)    Family History  Problem Relation Age of Onset  . Depression Mother   . Hypertension Father   . Diabetes Father     Current Outpatient Medications (Endocrine & Metabolic):  .  progesterone (PROMETRIUM) 100 MG capsule, Take 300 mg by mouth daily. .  THYROID PO, Take by mouth.    Current  Outpatient Medications (Analgesics):  Marland Kitchen  Ibuprofen-Famotidine (DUEXIS) 800-26.6 MG TABS, Take 800 mg by mouth 3 (three) times daily. .  Ibuprofen-Famotidine (DUEXIS) 800-26.6 MG TABS, Take 1 tablet by mouth 3 (three) times daily as needed. .  SUMAtriptan (IMITREX) 50 MG tablet, Take 50 mg by mouth as directed.   Current Outpatient Medications (Other):  Marland Kitchen  Cholecalciferol (VITAMIN D3) 1000 units CAPS, Take 1 capsule by mouth daily. .  hydrOXYzine (ATARAX/VISTARIL) 10 MG tablet, Take 1 tablet (10 mg total) by mouth 3 (three) times daily as needed. .  Menaquinone-7 (VITAMIN K2) 100 MCG CAPS, Take 1 capsule by mouth daily. .  Multiple Vitamin (MULTIVITAMIN) tablet, Take 1 tablet by mouth  daily. .  Probiotic Product (PROBIOTIC-10 PO), Take 1 capsule by mouth daily. .  riboflavin (VITAMIN B-2) 100 MG TABS tablet, Take 100 mg by mouth daily.   Reviewed prior external information including notes and imaging from  primary care provider As well as notes that were available from care everywhere and other healthcare systems.  Past medical history, social, surgical and family history all reviewed in electronic medical record.  No pertanent information unless stated regarding to the chief complaint.   Review of Systems:  No  visual changes, nausea, vomiting, diarrhea, constipation, dizziness, abdominal pain, skin rash, fevers, chills, night sweats, weight loss, swollen lymph nodes, body aches, joint swelling, chest pain, shortness of breath, mood changes. POSITIVE muscle aches, headache  Objective  Blood pressure 136/80, pulse 74, height 5' 3.5" (1.613 m), weight 136 lb (61.7 kg), SpO2 98 %.   General: No apparent distress alert and oriented x3 mood and affect normal, dressed appropriately.  HEENT: Pupils equal, extraocular movements intact  Respiratory: Patient's speak in full sentences and does not appear short of breath  Cardiovascular: No lower extremity edema, non tender, no erythema  Neuro: Cranial nerves II through XII are intact, neurovascularly intact in all extremities with 2+ DTRs and 2+ pulses.  Gait normal with good balance and coordination.  MSK:  Non tender with full range of motion and good stability and symmetric strength and tone of shoulders, elbows, wrist, hip, knee and ankles bilaterally.  Back exam does have some loss of lordosis Patient neck does have some loss of lordosis.  Significant tightness noted in the cranial cervical area right greater than left.  Patient does have limited sidebending and rotation of 5 to 10 degrees.  Osteopathic findings C2 flexed rotated and side bent right C6 flexed rotated and side bent left T3 extended rotated and side bent  right inhaled third rib    Impression and Recommendations:     This case required medical decision making of moderate complexity. The above documentation has been reviewed and is accurate and complete Judi Saa, DO       Note: This dictation was prepared with Dragon dictation along with smaller phrase technology. Any transcriptional errors that result from this process are unintentional.

## 2019-12-09 NOTE — Assessment & Plan Note (Signed)
   Decision today to treat with OMT was based on Physical Exam  After verbal consent patient was treated with HVLA, ME, FPR techniques in cervical, thoracic, rib,areas, all areas are chronic   Patient tolerated the procedure well with improvement in symptoms  Patient given exercises, stretches and lifestyle modifications  See medications in patient instructions if given  Patient will follow up in 4-8 weeks 

## 2019-12-09 NOTE — Patient Instructions (Addendum)
Good to see you 60 Toradol today Watch headaches See me again in 4 weeks

## 2019-12-13 DIAGNOSIS — F411 Generalized anxiety disorder: Secondary | ICD-10-CM | POA: Diagnosis not present

## 2019-12-13 DIAGNOSIS — M542 Cervicalgia: Secondary | ICD-10-CM | POA: Diagnosis not present

## 2019-12-13 DIAGNOSIS — R5381 Other malaise: Secondary | ICD-10-CM | POA: Diagnosis not present

## 2019-12-13 DIAGNOSIS — N943 Premenstrual tension syndrome: Secondary | ICD-10-CM | POA: Diagnosis not present

## 2019-12-17 ENCOUNTER — Ambulatory Visit: Payer: BLUE CROSS/BLUE SHIELD | Admitting: Family Medicine

## 2019-12-31 DIAGNOSIS — E7210 Disorders of sulfur-bearing amino-acid metabolism, unspecified: Secondary | ICD-10-CM | POA: Diagnosis not present

## 2019-12-31 DIAGNOSIS — R5381 Other malaise: Secondary | ICD-10-CM | POA: Diagnosis not present

## 2019-12-31 DIAGNOSIS — R519 Headache, unspecified: Secondary | ICD-10-CM | POA: Diagnosis not present

## 2019-12-31 DIAGNOSIS — R5383 Other fatigue: Secondary | ICD-10-CM | POA: Diagnosis not present

## 2020-01-15 ENCOUNTER — Other Ambulatory Visit: Payer: Self-pay

## 2020-01-15 ENCOUNTER — Encounter: Payer: Self-pay | Admitting: Family Medicine

## 2020-01-15 ENCOUNTER — Ambulatory Visit (INDEPENDENT_AMBULATORY_CARE_PROVIDER_SITE_OTHER): Payer: BLUE CROSS/BLUE SHIELD | Admitting: Family Medicine

## 2020-01-15 VITALS — BP 122/80 | HR 85 | Ht 63.5 in | Wt 134.0 lb

## 2020-01-15 DIAGNOSIS — G43711 Chronic migraine without aura, intractable, with status migrainosus: Secondary | ICD-10-CM | POA: Diagnosis not present

## 2020-01-15 DIAGNOSIS — M999 Biomechanical lesion, unspecified: Secondary | ICD-10-CM | POA: Diagnosis not present

## 2020-01-15 DIAGNOSIS — R519 Headache, unspecified: Secondary | ICD-10-CM

## 2020-01-15 DIAGNOSIS — G4486 Cervicogenic headache: Secondary | ICD-10-CM

## 2020-01-15 DIAGNOSIS — M542 Cervicalgia: Secondary | ICD-10-CM | POA: Diagnosis not present

## 2020-01-15 NOTE — Assessment & Plan Note (Signed)
   Decision today to treat with OMT was based on Physical Exam  After verbal consent patient was treated with HVLA, ME, FPR techniques in cervical, thoracic, rib areas, all areas are chronic   Patient tolerated the procedure well with improvement in symptoms  Patient given exercises, stretches and lifestyle modifications  See medications in patient instructions if given  Patient will follow up in 4-8 weeks 

## 2020-01-15 NOTE — Patient Instructions (Addendum)
Good to see you Madison [name]  See me again in 8 weeks

## 2020-01-15 NOTE — Assessment & Plan Note (Signed)
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 Patient is concerned that her breast augmentation could be contributing as well and will refer to plastic surgery to discuss the possibility of removal. Patient has done everything else correctly including home exercise, physical therapy, as well as medication. Continues to have chronic problems low. Discussed icing regimen, which activities to potentially avoid. Discussed topical anti-inflammatories. Discussed oral anti-inflammatories. Responds well to manipulation and follow-up again in 6 to 8 weeks

## 2020-01-15 NOTE — Progress Notes (Signed)
[place] [address] [place] Phone: [phone] Subjective:   I Madison [name] am serving as a Education administrator for Dr. Hulan Saas.  This visit occurred during the SARS-CoV-2 public health emergency.  Safety protocols were in place, including screening questions prior to the visit, additional usage of staff PPE, and extensive cleaning of exam room while observing appropriate contact time as indicated for disinfecting solutions.   I'm seeing this patient by the request  of:  [facility]  CC: Headache follow-up  [id number]  Madison [name] is a 49 y.o. female coming in with complaint of neck pain and headaches. Last seen on 12/09/2019 for OMT. Patient states she had a headache recently. Left sided pain from the hip to the calf as well as left sided back pain.  Patient does feel that her breast tissue and breast augmentation does contribute.  Has tried to change bras on multiple occasions but continues to have difficulty.     Past Medical History:  Diagnosis Date  . Migraine    Past Surgical History:  Procedure Laterality Date  . BREAST SURGERY    . OTHER SURGICAL HISTORY     Various reconstruction from accident   Social History   Socioeconomic History  . Marital status: Married    Spouse name: Madison Beard "Bud"  . Number of children: 2  . Years of education: Not on file  . Highest education level: Bachelor's degree (e.g., BA, AB, BS)  Occupational History  . Occupation: interior Manufacturing engineer: Futures trader  Tobacco Use  . Smoking status: Never Smoker  . Smokeless tobacco: Never Used  Substance and Sexual Activity  . Alcohol use: No  . Drug use: No  . Sexual activity: Not on file  Other Topics Concern  . Not on file  Social History Narrative   Patient is right-handed. She lives with her husband and 2 teenagers in a 2 story house. They have one dog. Patient drinks 1 cup of coffee a day. She  walks 2-3 miles, goes to the gym or does yoga daily.    Social Determinants of Health   Financial Resource Strain:   . Difficulty of Paying Living Expenses:   Food Insecurity:   . Worried About Charity fundraiser in the Last Year:   . Arboriculturist in the Last Year:   Transportation Needs:   . Film/video editor (Medical):   Marland Kitchen Lack of Transportation (Non-Medical):   Physical Activity:   . Days of Exercise per Week:   . Minutes of Exercise per Session:   Stress:   . Feeling of Stress :   Social Connections:   . Frequency of Communication with Friends and Family:   . Frequency of Social Gatherings with Friends and Family:   . Attends Religious Services:   . Active Member of Clubs or Organizations:   . Attends Archivist Meetings:   Marland Kitchen Marital Status:    Allergies  Allergen Reactions  . Lactose Intolerance (Gi)    Family History  Problem Relation Age of Onset  . Depression Mother   . Hypertension Father   . Diabetes Father     Current Outpatient Medications (Endocrine & Metabolic):  .  progesterone (PROMETRIUM) 100 MG capsule, Take 300 mg by mouth daily. .  THYROID PO, Take by mouth.    Current Outpatient Medications (Analgesics):  Marland Kitchen  Ibuprofen-Famotidine (DUEXIS) 800-26.6 MG TABS, Take 800 mg by  mouth 3 (three) times daily. .  Ibuprofen-Famotidine (DUEXIS) 800-26.6 MG TABS, Take 1 tablet by mouth 3 (three) times daily as needed. .  SUMAtriptan (IMITREX) 50 MG tablet, Take 50 mg by mouth as directed.   Current Outpatient Medications (Other):  Marland Kitchen  Cholecalciferol (VITAMIN D3) 1000 units CAPS, Take 1 capsule by mouth daily. .  hydrOXYzine (ATARAX/VISTARIL) 10 MG tablet, Take 1 tablet (10 mg total) by mouth 3 (three) times daily as needed. .  Menaquinone-7 (VITAMIN K2) 100 MCG CAPS, Take 1 capsule by mouth daily. .  Multiple Vitamin (MULTIVITAMIN) tablet, Take 1 tablet by mouth daily. .  Probiotic Product (PROBIOTIC-10 PO), Take 1 capsule by mouth  daily. .  riboflavin (VITAMIN B-2) 100 MG TABS tablet, Take 100 mg by mouth daily.   Reviewed prior external information including notes and imaging from  primary care provider As well as notes that were available from care everywhere and other healthcare systems.  Past medical history, social, surgical and family history all reviewed in electronic medical record.  No pertanent information unless stated regarding to the chief complaint.   Review of Systems:  No , visual changes, nausea, vomiting, diarrhea, constipation, dizziness, abdominal pain, skin rash, fevers, chills, night sweats, weight loss, swollen lymph nodes, body aches, joint swelling, chest pain, shortness of breath, mood changes. POSITIVE muscle aches, headache  Objective  Blood pressure 122/80, pulse 85, height 5' 3.5" (1.613 m), weight 134 lb (60.8 kg), SpO2 99 %.   General: No apparent distress alert and oriented x3 mood and affect normal, dressed appropriately.  HEENT: Pupils equal, extraocular movements intact  Respiratory: Patient's speak in full sentences and does not appear short of breath  Cardiovascular: No lower extremity edema, non tender, no erythema  Neuro: Cranial nerves II through XII are intact, neurovascularly intact in all extremities with 2+ DTRs and 2+ pulses.  Gait normal with good balance and coordination.  MSK:  Non tender with full range of motion and good stability and symmetric strength and tone of shoulders, elbows, wrist, hip, knee and ankles bilaterally.  Neck exam does have some mild tightness noted diffusely.  Negative Spurling's.  Tightness more left than right.  Mild limited left-sided rotation.  Tightness in the right parascapular region though.  Osteopathic findings C2 flexed rotated and side bent right C4 flexed rotated and side bent left C6 flexed rotated and side bent left T3 extended rotated and side bent right inhaled third rib     Impression and Recommendations:     This case  required medical decision making of moderate complexity. The above documentation has been reviewed and is accurate and complete Madison Saa, DO       Note: This dictation was prepared with Dragon dictation along with smaller phrase technology. Any transcriptional errors that result from this process are unintentional.

## 2020-02-20 ENCOUNTER — Ambulatory Visit (HOSPITAL_BASED_OUTPATIENT_CLINIC_OR_DEPARTMENT_OTHER)
Admission: RE | Admit: 2020-02-20 | Discharge: 2020-02-20 | Disposition: A | Discharge status: Home / Self Care | Payer: BLUE CROSS/BLUE SHIELD | Source: Ambulatory Visit | Attending: Emergency Medicine | Admitting: Emergency Medicine

## 2020-02-20 ENCOUNTER — Emergency Department (INDEPENDENT_AMBULATORY_CARE_PROVIDER_SITE_OTHER)
Admission: EM | Admit: 2020-02-20 | Discharge: 2020-02-20 | Disposition: A | Discharge status: Home / Self Care | Payer: BLUE CROSS/BLUE SHIELD | Source: Home / Self Care

## 2020-02-20 ENCOUNTER — Other Ambulatory Visit: Payer: Self-pay

## 2020-02-20 DIAGNOSIS — R102 Pelvic and perineal pain: Secondary | ICD-10-CM | POA: Diagnosis not present

## 2020-02-20 DIAGNOSIS — R319 Hematuria, unspecified: Secondary | ICD-10-CM | POA: Diagnosis not present

## 2020-02-20 DIAGNOSIS — R109 Unspecified abdominal pain: Secondary | ICD-10-CM | POA: Diagnosis not present

## 2020-02-20 DIAGNOSIS — R300 Dysuria: Secondary | ICD-10-CM | POA: Diagnosis not present

## 2020-02-20 LAB — POCT URINALYSIS DIP (MANUAL ENTRY)
Bilirubin, UA: NEGATIVE
Blood, UA: NEGATIVE
Glucose, UA: NEGATIVE mg/dL
Leukocytes, UA: NEGATIVE
Nitrite, UA: NEGATIVE
Spec Grav, UA: 1.025 (ref 1.010–1.025)
Urobilinogen, UA: 0.2 E.U./dL
pH, UA: 5.5 (ref 5.0–8.0)

## 2020-02-20 MED ORDER — ONDANSETRON 4 MG PO TBDP: 4.0000 mg | ORAL_TABLET | Freq: Three times a day (TID) | ORAL | 0 refills | Status: DC | PRN

## 2020-02-20 NOTE — Discharge Instructions (Signed)
  Please go to  [facility] for your scheduled ultrasound.  Let them know you were sent from [facility].  Stay well hydrated. Call to schedule a follow up appointment with primary care next week if not improving.

## 2020-02-20 NOTE — ED Triage Notes (Signed)
Patient presents to Urgent Care with complaints of left flank pain and left groin pain intermittently for a few weeks. Patient reports she finished her period 4 days ago and then since yesterday, she has noticed blood on the tissue when she wipes after urinating, reports urinary urgency and burning.

## 2020-02-20 NOTE — ED Provider Notes (Signed)
[facility]    CSN: [id number] Arrival date & time: 02/20/20  1151      History   Chief Complaint Chief Complaint  Patient presents with  . Flank Pain    HPI Madison [name] is a 49 y.o. female.   HPI  Madison [name] is a 49 y.o. female presenting to UC with c/o intermittent Left sided flank pain for a few weeks, associated hematuria, nausea, and Left lower pelvic pain started yesterday along with urinary frequency and burning. She has only had 1 UTI several years ago. Hx of ovarian cysts but she is not sure if this pain is similar, she does not recall having back pain when dx with cysts. No hx of kidney stones. Denies fever, chills vomiting or diarrhea. Mild fatigue she attributes to moving furniture all weekend to set up a large display room but denies any injury to her back.    Past Medical History:  Diagnosis Date  . Migraine     Patient Active Problem List   Diagnosis Date Noted  . Trigger point of shoulder region 12/20/2018  . Migraine 06/07/2018  . Cervical radiculopathy 03/15/2018  . 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 Nonallopathic lesion of thoracic region 02/28/2018  . Nonallopathic lesion of rib cage 02/28/2018  . Nonallopathic lesion of cervical region 02/28/2018    Past Surgical History:  Procedure Laterality Date  . BREAST SURGERY    . OTHER SURGICAL HISTORY     Various reconstruction from accident    OB History   No obstetric history on file.      Home Medications    Prior to Admission medications   Medication Sig Start Date End Date Taking? Authorizing Provider  Ibuprofen-Famotidine (DUEXIS) 800-26.6 MG TABS Take 1 tablet by mouth 3 (three) times daily as needed. 09/04/19  Yes Judi Saa, DO  Cholecalciferol (VITAMIN D3) 1000 units CAPS Take 1 capsule by mouth daily.    [provider]  hydrOXYzine (ATARAX/VISTARIL) 10 MG tablet Take 1 tablet (10 mg total) by mouth 3 (three) times daily as needed. 12/20/18    Judi Saa, DO  Ibuprofen-Famotidine (DUEXIS) 800-26.6 MG TABS Take 800 mg by mouth 3 (three) times daily. 07/24/19   Judi Saa, DO  Menaquinone-7 (VITAMIN K2) 100 MCG CAPS Take 1 capsule by mouth daily.    [provider]  Multiple Vitamin (MULTIVITAMIN) tablet Take 1 tablet by mouth daily.    [provider]  ondansetron (ZOFRAN-ODT) 4 MG disintegrating tablet Take 1 tablet (4 mg total) by mouth every 8 (eight) hours as needed for nausea or vomiting. 02/20/20   Lurene Shadow, PA-C  Probiotic Product (PROBIOTIC-10 PO) Take 1 capsule by mouth daily.    [provider]  progesterone (PROMETRIUM) 100 MG capsule Take 300 mg by mouth daily.    [provider]  riboflavin (VITAMIN B-2) 100 MG TABS tablet Take 100 mg by mouth daily.    [provider]  SUMAtriptan (IMITREX) 50 MG tablet Take 50 mg by mouth as directed. 01/22/18   [provider]  THYROID PO Take by mouth.    [provider]    Family History Family History  Problem Relation Age of Onset  . Depression Mother   . Hypertension Father   . Diabetes Father     Social History Social History   Tobacco Use  . Smoking status: Never Smoker  . Smokeless tobacco: Never Used  Substance Use Topics  . Alcohol use: Yes  Comment: maybe twice per month  . Drug use: No     Allergies   Lactose intolerance (gi)   Review of Systems Review of Systems  Constitutional: Negative for chills and fever.  Gastrointestinal: Positive for abdominal pain (Left lower) and nausea. Negative for diarrhea and vomiting.  Genitourinary: Positive for dysuria, flank pain ( Left), frequency, hematuria and pelvic pain (Left side). Negative for urgency, vaginal bleeding, vaginal discharge and vaginal pain.  Neurological: Negative for dizziness and headaches.     Physical Exam Triage Vital Signs ED Triage Vitals  Enc Vitals Group     BP 02/20/20 1211 121/81     Pulse Rate  02/20/20 1211 75     Resp 02/20/20 1211 18     Temp 02/20/20 1211 98.6 F (37 C)     Temp Source 02/20/20 1211 Oral     SpO2 02/20/20 1211 100 %     Weight --      Height --      Head Circumference --      Peak Flow --      Pain Score 02/20/20 1208 5     Pain Loc --      Pain Edu? --      Excl. in [place]? --    No data found.  Updated Vital Signs BP 121/81 (BP Location: Right Arm)   Pulse 75   Temp 98.6 F (37 C) (Oral)   Resp 18   SpO2 100%   Visual Acuity Right Eye Distance:   Left Eye Distance:   Bilateral Distance:    Right Eye Near:   Left Eye Near:    Bilateral Near:     Physical Exam Vitals and nursing note reviewed.  Constitutional:      General: She is not in acute distress.    Appearance: Normal appearance. She is well-developed. She is not ill-appearing, toxic-appearing or diaphoretic.  HENT:     Head: Normocephalic and atraumatic.  Cardiovascular:     Rate and Rhythm: Normal rate and regular rhythm.  Pulmonary:     Effort: Pulmonary effort is normal.     Breath sounds: Normal breath sounds.  Abdominal:     General: There is no distension.     Palpations: Abdomen is soft.     Tenderness: There is abdominal tenderness. There is no right CVA tenderness or left CVA tenderness.    Musculoskeletal:        General: Normal range of motion.     Cervical back: Normal range of motion.  Skin:    General: Skin is warm and dry.  Neurological:     Mental Status: She is alert and oriented to person, place, and time.  Psychiatric:        Behavior: Behavior normal.      UC Treatments / Results  Labs (all labs ordered are listed, but only abnormal results are displayed) Labs Reviewed  POCT URINALYSIS DIP (MANUAL ENTRY) - Abnormal; Notable for the following components:      Result Value   Ketones, POC UA trace (5) (*)    Protein Ur, POC trace (*)    All other components within normal limits    EKG   Radiology US PELVIS LIMITED (TRANSABDOMINAL  ONLY)  Result Date: 02/20/2020 CLINICAL DATA:  Left pelvic pain, hematuria EXAM: TRANSABDOMINAL ULTRASOUND OF PELVIS TECHNIQUE: Transabdominal ultrasound examination of the pelvis was performed including evaluation of the uterus, ovaries, adnexal regions, and pelvic cul-de-sac. COMPARISON:  None. FINDINGS: Uterus Measurements: 8.9  x 3.9 x 4.8 cm = volume: 87 mL. No fibroids or other mass visualized. Endometrium Thickness: 8 mm in thickness.  No focal abnormality visualized. Right ovary Measurements: 2.9 x 1.7 x 2.0 cm = volume: 5 mL. Normal appearance/no adnexal mass. Left ovary Measurements: 2.8 x 1.6 x 2.0 cm = volume: 5 mL. Normal appearance/no adnexal mass. Other findings:  No abnormal free fluid. IMPRESSION: Unremarkable study. Electronically Signed   By: Charlett Nose M.D.   On: 02/20/2020 15:03    Procedures Procedures (including critical care time)  Medications Ordered in UC Medications - No data to display  Initial Impression / Assessment and Plan / UC Course  I have reviewed the triage vital signs and the nursing notes.  Pertinent labs & imaging results that were available during my care of the patient were reviewed by me and considered in my medical decision making (see chart for details).     Suspect renal stone. [place] unavailable at [facility], discussed symptomatic tx and f/u as needed. Pt would like to get [place] at [facility] today, pt has another busy weekend for work.  Encouraged f/u with PCP next week if not improving.  Final Clinical Impressions(s) / UC Diagnoses   Final diagnoses:  Left flank pain  Acute pelvic pain, female     Discharge Instructions      Please go to  [facility] for your scheduled ultrasound.  Let them know you were sent from [facility].  Stay well hydrated. Call to schedule a follow up appointment with primary care next week if not improving.     ED Prescriptions    Medication Sig Dispense Auth.  Provider   ondansetron (ZOFRAN-ODT) 4 MG disintegrating tablet Take 1 tablet (4 mg total) by mouth every 8 (eight) hours as needed for nausea or vomiting. 12 tablet Lurene Shadow, [place]     PDMP not reviewed this encounter.   Lurene Shadow, [place] 02/20/20 1610

## 2020-02-21 ENCOUNTER — Telehealth: Payer: Self-pay

## 2020-02-21 NOTE — Telephone Encounter (Signed)
Pt called requesting [place] results be sent to Urology so they could work her in today. Spoke with [facility] office who was able to pull up results in Epic. Triage nurse will call pt today to schedule f/u

## 2020-02-26 ENCOUNTER — Ambulatory Visit (INDEPENDENT_AMBULATORY_CARE_PROVIDER_SITE_OTHER): Payer: BLUE CROSS/BLUE SHIELD | Admitting: Family Medicine

## 2020-02-26 ENCOUNTER — Other Ambulatory Visit: Payer: Self-pay

## 2020-02-26 ENCOUNTER — Encounter: Payer: Self-pay | Admitting: Family Medicine

## 2020-02-26 VITALS — BP 112/80 | HR 79 | Ht 63.0 in | Wt 139.0 lb

## 2020-02-26 DIAGNOSIS — G4486 Cervicogenic headache: Secondary | ICD-10-CM

## 2020-02-26 DIAGNOSIS — R319 Hematuria, unspecified: Secondary | ICD-10-CM | POA: Insufficient documentation

## 2020-02-26 DIAGNOSIS — M2550 Pain in unspecified joint: Secondary | ICD-10-CM | POA: Diagnosis not present

## 2020-02-26 DIAGNOSIS — R519 Headache, unspecified: Secondary | ICD-10-CM

## 2020-02-26 DIAGNOSIS — G5702 Lesion of sciatic nerve, left lower limb: Secondary | ICD-10-CM | POA: Insufficient documentation

## 2020-02-26 DIAGNOSIS — M999 Biomechanical lesion, unspecified: Secondary | ICD-10-CM

## 2020-02-26 LAB — URINALYSIS
Bilirubin Urine: NEGATIVE
Hgb urine dipstick: NEGATIVE
Ketones, ur: NEGATIVE
Leukocytes,Ua: NEGATIVE
Nitrite: NEGATIVE
Specific Gravity, Urine: 1.01 (ref 1.000–1.030)
Total Protein, Urine: NEGATIVE
Urine Glucose: NEGATIVE
Urobilinogen, UA: 0.2 (ref 0.0–1.0)
pH: 6 (ref 5.0–8.0)

## 2020-02-26 NOTE — Assessment & Plan Note (Signed)
Chronic problem with very mild exacerbation.  We discussed the Duexis.  Patient recently had what appears to be potentially a kidney stone located urinalysis as well.  Patient will come back and see me again in 6 to 8 weeks otherwise

## 2020-02-26 NOTE — Patient Instructions (Signed)
Labs today Piriformis exercises See me again in 7-8 weeks

## 2020-02-26 NOTE — Progress Notes (Signed)
[name] [address] [place] [address] Phone: [phone] Subjective:    Madison [name], am serving as a scribe for Dr. Antoine Primas. This visit occurred during the SARS-CoV-2 public health emergency.  Safety protocols were in place, including screening questions prior to the visit, additional usage of staff PPE, and extensive cleaning of exam room while observing appropriate contact time as indicated for disinfecting solutions.  eing this patient by the request  of:  [facility]  CC: Neck and back pain     [id number]  Madison [name] is a 49 y.o. female coming in with complaint of back and neck pain. OMT 01/14/2020. Patient states that she continues to have pain in her neck and shoulders. Had 2 migraines since last visit. Also having pain in left SI joint. Has a cyst on her kidney which she is seeing a urologist for in late June.  Ultrasound showed mild cyst noted on the left kidney.  This was independently visualized by me.  Was complex and septated.          Reviewed prior external information including notes and imaging from previsou exam, outside providers and external EMR if available.   As well as notes that were available from care everywhere and other healthcare systems.  Past medical history, social, surgical and family history all reviewed in electronic medical record.  No pertanent information unless stated regarding to the chief complaint.   Past Medical History:  Diagnosis Date  . Migraine     Allergies  Allergen Reactions  . Lactose Intolerance (Gi)     Gives her migraines     Review of Systems:  No headache, visual changes, nausea, vomiting, diarrhea, constipation, dizziness, abdominal pain, skin rash, fevers, chills, night sweats, weight loss, swollen lymph nodes, body aches, joint swelling, chest pain, shortness of breath, mood changes. POSITIVE muscle aches  Objective  Blood  pressure 112/80, pulse 79, height 5\' 3"  (1.6 m), weight 139 lb (63 kg), SpO2 99 %.   General: No apparent distress alert and oriented x3 mood and affect normal, dressed appropriately.  HEENT: Pupils equal, extraocular movements intact  Respiratory: Patient's speak in full sentences and does not appear short of breath  Cardiovascular: No lower extremity edema, non tender, no erythema  Neuro: Cranial nerves II through XII are intact, neurovascularly intact in all extremities with 2+ DTRs and 2+ pulses.  Gait normal with good balance and coordination.  MSK:  Non tender with full range of motion and good stability and symmetric strength and tone of shoulders, elbows, wrist, hip, knee and ankles bilaterally.  Neck exam did show that patient did have some mild tightness on the left side of the neck at the very base in the C6-C7 area as well as in the upper thoracic left greater than right.  Low back exam does show the patient does have more of a piriformis syndrome.  Patient does have tightness noted with the piriformis.  Minimal pain over the flank area the patient was having pain previously she states.  Osteopathic findings  C2 flexed rotated and side bent right C6 flexed rotated and side bent left T4 extended rotated and side bent left inhaled rib T8 extended rotated and side bent right L2 flexed rotated and side bent left Sacrum right on right       Assessment and Plan:    Nonallopathic problems  Decision today to treat with OMT was based on Physical Exam  After verbal consent patient was treated with HVLA, ME, FPR techniques in cervical, rib, thoracic, lumbar, and sacral  areas  Patient tolerated the procedure well with improvement in symptoms  Patient given exercises, stretches and lifestyle modifications  See medications in patient instructions if given  Patient will follow up in 4-8 weeks      The above documentation has been reviewed and is accurate and complete  Lyndal Pulley, DO       Note: This dictation was prepared with Dragon dictation along with smaller phrase technology. Any transcriptional errors that result from this process are unintentional.

## 2020-02-26 NOTE — Assessment & Plan Note (Signed)
Patient did have hematuria as well as left-sided flank pain.  Concern for the potential for kidney stone previously.  Patient has been doing much better with the low back pain at the moment does have some mild piriformis syndrome performed and was given some exercises.  I would like to get a repeat urinalysis before we consider the advanced imaging to further evaluate the cyst.  Patient is in agreement with this and has declined the MRI at this time.  Has an appointment with urology at the end of this month

## 2020-02-26 NOTE — Assessment & Plan Note (Signed)

## 2020-02-26 NOTE — Assessment & Plan Note (Signed)
Using Netter's Orthopaedic Anatomy, reviewed with the patient the structures involved and how they related to diagnosis. The patient indicated understanding.   The patient was given a handout about classic piriformis stretching including Pigeon Pose, Modified Pigeon Pose, my self-described "Sink Stretch," and other piriformis rehab.  We also reviewed hip flexor and abductor strengthening, ham stretching  Rec deep massage, explained self-massage with ball  

## 2020-03-10 DIAGNOSIS — R109 Unspecified abdominal pain: Secondary | ICD-10-CM | POA: Diagnosis not present

## 2020-03-10 DIAGNOSIS — R319 Hematuria, unspecified: Secondary | ICD-10-CM | POA: Diagnosis not present

## 2020-03-11 DIAGNOSIS — N83202 Unspecified ovarian cyst, left side: Secondary | ICD-10-CM | POA: Diagnosis not present

## 2020-03-11 DIAGNOSIS — N83201 Unspecified ovarian cyst, right side: Secondary | ICD-10-CM | POA: Diagnosis not present

## 2020-03-11 DIAGNOSIS — K3189 Other diseases of stomach and duodenum: Secondary | ICD-10-CM | POA: Diagnosis not present

## 2020-03-11 DIAGNOSIS — R319 Hematuria, unspecified: Secondary | ICD-10-CM | POA: Diagnosis not present

## 2020-03-16 DIAGNOSIS — R319 Hematuria, unspecified: Secondary | ICD-10-CM | POA: Diagnosis not present

## 2020-04-09 DIAGNOSIS — E7210 Disorders of sulfur-bearing amino-acid metabolism, unspecified: Secondary | ICD-10-CM | POA: Diagnosis not present

## 2020-04-09 DIAGNOSIS — R5381 Other malaise: Secondary | ICD-10-CM | POA: Diagnosis not present

## 2020-04-09 DIAGNOSIS — M545 Low back pain: Secondary | ICD-10-CM | POA: Diagnosis not present

## 2020-04-09 DIAGNOSIS — R519 Headache, unspecified: Secondary | ICD-10-CM | POA: Diagnosis not present

## 2020-04-09 DIAGNOSIS — F411 Generalized anxiety disorder: Secondary | ICD-10-CM | POA: Diagnosis not present

## 2020-04-09 DIAGNOSIS — E039 Hypothyroidism, unspecified: Secondary | ICD-10-CM | POA: Diagnosis not present

## 2020-04-09 DIAGNOSIS — R5383 Other fatigue: Secondary | ICD-10-CM | POA: Diagnosis not present

## 2020-04-15 ENCOUNTER — Encounter: Payer: Self-pay | Admitting: Family Medicine

## 2020-04-15 ENCOUNTER — Other Ambulatory Visit: Payer: Self-pay

## 2020-04-15 ENCOUNTER — Ambulatory Visit (INDEPENDENT_AMBULATORY_CARE_PROVIDER_SITE_OTHER): Payer: BLUE CROSS/BLUE SHIELD | Admitting: Family Medicine

## 2020-04-15 VITALS — BP 128/80 | HR 65 | Ht 63.0 in | Wt 139.0 lb

## 2020-04-15 DIAGNOSIS — M999 Biomechanical lesion, unspecified: Secondary | ICD-10-CM | POA: Diagnosis not present

## 2020-04-15 DIAGNOSIS — R519 Headache, unspecified: Secondary | ICD-10-CM | POA: Diagnosis not present

## 2020-04-15 DIAGNOSIS — G4486 Cervicogenic headache: Secondary | ICD-10-CM

## 2020-04-15 NOTE — Assessment & Plan Note (Signed)
Patient is stable overall.  Is making progress.  Discussed which activities to doing which wants to avoid.  Increase activity.  Patient will see me again in 6 to 8 weeks

## 2020-04-15 NOTE — Progress Notes (Signed)
[name] [address] [place] [address] Phone: [phone] Subjective:   I Madison [name] am serving as a Neurosurgeon for Dr. Antoine Primas.  This visit occurred during the SARS-CoV-2 public health emergency.  Safety protocols were in place, including screening questions prior to the visit, additional usage of staff PPE, and extensive cleaning of exam room while observing appropriate contact time as indicated for disinfecting solutions.   I'm seeing this patient by the request  of:  [facility]  CC: Neck pain and headache follow-up IRW:[id number]  Madison [name] is a 49 y.o. female coming in with complaint of back and neck pain. OMT 02/26/2020. Patient states she has a strain in her neck and back due to helping her daughter move.  Medications patient has been prescribed: Ibuprofen intermittently  Taking: Intermittently         Reviewed prior external information including notes and imaging from previsou exam, outside providers and external EMR if available.   As well as notes that were available from care everywhere and other healthcare systems.  Past medical history, social, surgical and family history all reviewed in electronic medical record.  No pertanent information unless stated regarding to the chief complaint.   Past Medical History:  Diagnosis Date  . Migraine     Allergies  Allergen Reactions  . Lactose Intolerance (Gi)     Gives her migraines     Review of Systems:  No headache, visual changes, nausea, vomiting, diarrhea, constipation, dizziness, abdominal pain, skin rash, fevers, chills, night sweats, weight loss, swollen lymph nodes, body aches, joint swelling, chest pain, shortness of breath, mood changes. POSITIVE muscle aches  Objective  Blood pressure 128/80, pulse 65, height 5\' 3"  (1.6 m), weight 139 lb (63 kg), SpO2 98 %.   General: No apparent distress alert and oriented x3 mood and affect  normal, dressed appropriately.  HEENT: Pupils equal, extraocular movements intact  Respiratory: Patient's speak in full sentences and does not appear short of breath  Cardiovascular: No lower extremity edema, non tender, no erythema  Neuro: Cranial nerves II through XII are intact, neurovascularly intact in all extremities with 2+ DTRs and 2+ pulses.  Gait normal with good balance and coordination.  MSK:  Non tender with full range of motion and good stability and symmetric strength and tone of shoulders, elbows, wrist, hip, knee and ankles bilaterally.  Neck exam does show some mild loss of lordosis.  Tenderness to palpation in the parascapular region left greater than right.  Some mild tightness of the lower back as well which is a little bit different than patient's previous exam.  Osteopathic findings  C2 flexed rotated and side bent right C6 flexed rotated and side bent left T3 extended rotated and side bent right inhaled rib        Assessment and Plan:    Nonallopathic problems  Decision today to treat with OMT was based on Physical Exam  After verbal consent patient was treated with HVLA, ME, FPR techniques in cervical, rib, thoracic, lumbar, and sacral  areas  Patient tolerated the procedure well with improvement in symptoms  Patient given exercises, stretches and lifestyle modifications  See medications in patient instructions if given  Patient will follow up in 4-8 weeks      The above documentation has been reviewed and is accurate and complete , DO       Note: This dictation was prepared with Dragon dictation along with smaller  Company secretary. Any transcriptional errors that result from this process are unintentional.

## 2020-04-15 NOTE — Patient Instructions (Signed)
Good to see you Continue exercises See me again in 6-8 weeks

## 2020-04-22 DIAGNOSIS — R319 Hematuria, unspecified: Secondary | ICD-10-CM | POA: Diagnosis not present

## 2020-05-29 NOTE — Progress Notes (Signed)
[name] [address] [place] [address] Phone: [phone] Subjective:   Bruce Donath, am serving as a scribe for Dr. Antoine Primas. This visit occurred during the SARS-CoV-2 public health emergency.  Safety protocols were in place, including screening questions prior to the visit, additional usage of staff PPE, and extensive cleaning of exam room while observing appropriate contact time as indicated for disinfecting solutions.   I'm seeing this patient by the request  of:  [facility]  CC: Neck pain and headache follow-up  EUM:[id number]  Jennie Peele-Page is a 49 y.o. female coming in with complaint of back and neck pain. OMT 04/15/2020. Patient states that she was doing ok. Just set up for pre-market and is now tight in scapula and neck.   Medications patient has been prescribed: Duexis and Atarax  Taking: Intermittently         Reviewed prior external information including notes and imaging from previsou exam, outside providers and external EMR if available.   As well as notes that were available from care everywhere and other healthcare systems.  Past medical history, social, surgical and family history all reviewed in electronic medical record.  No pertanent information unless stated regarding to the chief complaint.   Past Medical History:  Diagnosis Date  . Migraine     Allergies  Allergen Reactions  . Lactose Intolerance (Gi)     Gives her migraines     Review of Systems:  No  visual changes, nausea, vomiting, diarrhea, constipation, dizziness, abdominal pain, skin rash, fevers, chills, night sweats, weight loss, swollen lymph nodes, body aches, joint swelling, chest pain, shortness of breath, mood changes. POSITIVE muscle aches, headache  Objective  Blood pressure 110/80, pulse (!) 51, height 5\' 3"  (1.6 m), weight 137 lb (62.1 kg), SpO2 99 %.   General: No apparent distress alert and oriented  x3 mood and affect normal, dressed appropriately.  HEENT: Pupils equal, extraocular movements intact  Respiratory: Patient's speak in full sentences and does not appear short of breath  Cardiovascular: No lower extremity edema, non tender, no erythema  Neuro: Cranial nerves II through XII are intact, neurovascularly intact in all extremities with 2+ DTRs and 2+ pulses.  Gait normal with good balance and coordination.  MSK:  Non tender with full range of motion and good stability and symmetric strength and tone of shoulders, elbows, wrist, hip, knee and ankles bilaterally.  Back - Normal skin, Spine with normal alignment and no deformity.  No tenderness to vertebral process palpation.  Paraspinous muscles are not tender and without spasm.   Range of motion is full at neck and lumbar sacral regions  Osteopathic findings  C7 flexed rotated and side bent left T3 extended rotated and side bent right inhaled rib T9 extended rotated and side bent left L2 flexed rotated and side bent right Sacrum right on right       Assessment and Plan:  Cervicogenic headache Chronic problem with exacerbation.  Medications discussed duexis, atarax and otc meds.  Discused posture and ergonomics.  RTC in       Nonallopathic problems  Decision today to treat with OMT was based on Physical Exam  After verbal consent patient was treated with HVLA, ME, FPR techniques in cervical, rib, thoracic areas  Patient tolerated the procedure well with improvement in symptoms  Patient given exercises, stretches and lifestyle modifications  See medications in patient instructions if given  Patient will follow up in 4-8  weeks      The above documentation has been reviewed and is accurate and complete Lyndal Pulley, DO       Note: This dictation was prepared with Dragon dictation along with smaller phrase technology. Any transcriptional errors that result from this process are unintentional.

## 2020-05-31 NOTE — Assessment & Plan Note (Signed)
Chronic problem with exacerbation.  Medications discussed duexis, atarax and otc meds.  Discused posture and ergonomics.  RTC in

## 2020-06-02 ENCOUNTER — Other Ambulatory Visit: Payer: Self-pay

## 2020-06-02 ENCOUNTER — Encounter: Payer: Self-pay | Admitting: Family Medicine

## 2020-06-02 ENCOUNTER — Ambulatory Visit (INDEPENDENT_AMBULATORY_CARE_PROVIDER_SITE_OTHER): Payer: BLUE CROSS/BLUE SHIELD | Admitting: Family Medicine

## 2020-06-02 VITALS — BP 110/80 | HR 51 | Ht 63.0 in | Wt 137.0 lb

## 2020-06-02 DIAGNOSIS — R519 Headache, unspecified: Secondary | ICD-10-CM | POA: Diagnosis not present

## 2020-06-02 DIAGNOSIS — M999 Biomechanical lesion, unspecified: Secondary | ICD-10-CM | POA: Diagnosis not present

## 2020-06-02 DIAGNOSIS — G4486 Cervicogenic headache: Secondary | ICD-10-CM

## 2020-06-02 NOTE — Patient Instructions (Signed)
Remember posture  Glad you got the vaccine Cheryll Cockayne or Ria Clock for primary care See me in 6-8 weeks

## 2020-07-06 ENCOUNTER — Other Ambulatory Visit: Payer: Self-pay

## 2020-07-06 ENCOUNTER — Encounter (INDEPENDENT_AMBULATORY_CARE_PROVIDER_SITE_OTHER): Payer: Self-pay

## 2020-07-06 ENCOUNTER — Encounter: Payer: Self-pay | Admitting: Internal Medicine

## 2020-07-06 ENCOUNTER — Ambulatory Visit: Payer: BLUE CROSS/BLUE SHIELD | Admitting: Internal Medicine

## 2020-07-06 VITALS — BP 106/72 | HR 76 | Temp 98.1°F | Ht 63.0 in | Wt 137.0 lb

## 2020-07-06 DIAGNOSIS — R519 Headache, unspecified: Secondary | ICD-10-CM

## 2020-07-06 DIAGNOSIS — J358 Other chronic diseases of tonsils and adenoids: Secondary | ICD-10-CM | POA: Insufficient documentation

## 2020-07-06 DIAGNOSIS — Z9882 Breast implant status: Secondary | ICD-10-CM

## 2020-07-06 DIAGNOSIS — G8929 Other chronic pain: Secondary | ICD-10-CM

## 2020-07-06 DIAGNOSIS — D531 Other megaloblastic anemias, not elsewhere classified: Secondary | ICD-10-CM | POA: Insufficient documentation

## 2020-07-06 DIAGNOSIS — Q998 Other specified chromosome abnormalities: Secondary | ICD-10-CM | POA: Diagnosis not present

## 2020-07-06 MED ORDER — UBRELVY 50 MG PO TABS: 50.0000 mg | ORAL_TABLET | Freq: Every day | ORAL | 0 refills | Status: DC | PRN

## 2020-07-06 MED ORDER — METHOCARBAMOL 500 MG PO TABS: 500.0000 mg | ORAL_TABLET | Freq: Four times a day (QID) | ORAL | 2 refills | Status: DC | PRN

## 2020-07-06 NOTE — Assessment & Plan Note (Signed)
Frequent Cervicogenic and hormonal Sees Dr Katrinka Blazing for neck adjustments which help Gets monthly massage Has chronic upper back and neck pain - does stretches - would like to consider getting implants removed - will refer to plastic surgery Continue supplements Will try methocarbamol 500 mg at night or during day if needed - if not more effective than baclofen can increase baclofen or try tizanidine Trial of ubrevly prn - 50 mg as needed, may repeat x 1 after one hr

## 2020-07-06 NOTE — Progress Notes (Signed)
Subjective:    Patient ID: Madison [name], female    DOB: 20-Dec-1970, 49 y.o.   MRN: [id number]   This visit occurred during the SARS-CoV-2 public health emergency.  Safety protocols were in place, including screening questions prior to the visit, additional usage of staff PPE, and extensive cleaning of exam room while observing appropriate contact time as indicated for disinfecting solutions.    HPI She is here to establish with a new pcp.    Takes sumitriptan prn or duexis prn.  She does not like the way the imitrex makes her feel.  She sees Dr Katrinka Blazing -- his adjustments have helped.  Baclofen helps, but she wonders if something else may help more.    Walks 3-4 miles a day   Upper back and neck pain that is chronic - she thinks this triggers some of her headaches and the adjustments help, she gets a monthly massage.  She wonders if getting her implants removed would help.    Gets tonsil stones.  Tried apple cider vinegar, salt water gargles.  Wonders what else she can do.    Had uti last jan.  Had gorss hematuria.  Had uti.  Had another one this past summer.  Treated for uti.  Saw urol  Had pain -- was probably stones.  Cyst on kidney on uS> not on ct  Medications and allergies reviewed with patient and updated if appropriate.  Patient Active Problem List   Diagnosis Date Noted  . Hematuria 02/26/2020  . Piriformis syndrome, left 02/26/2020  . Trigger point of shoulder region 12/20/2018  . Migraine 06/07/2018  . Cervical radiculopathy 03/15/2018  . 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 Nonallopathic lesion of thoracic region 02/28/2018  . Nonallopathic lesion of rib cage 02/28/2018  . Nonallopathic lesion of cervical region 02/28/2018    Current Outpatient Medications on File Prior to Visit  Medication Sig Dispense Refill  . ARMOUR THYROID 30 MG tablet Take 30 mg by mouth daily.    Mack Guise THYROID 60 MG tablet Take by mouth.    . Biotin 5000 MCG CAPS Take by mouth.     . Cholecalciferol (VITAMIN D3) 1000 units CAPS Take 1 capsule by mouth daily.    . Ibuprofen-Famotidine (DUEXIS) 800-26.6 MG TABS Take 800 mg by mouth 3 (three) times daily. 90 tablet 3  . Ibuprofen-Famotidine (DUEXIS) 800-26.6 MG TABS Take 1 tablet by mouth 3 (three) times daily as needed. 90 tablet 3  . MAGNESIUM MALATE PO Take by mouth.    . Menaquinone-7 (VITAMIN K2) 100 MCG CAPS Take 1 capsule by mouth daily.    . Multiple Vitamin (MULTIVITAMIN) tablet Take 1 tablet by mouth daily.    . Probiotic Product (PROBIOTIC-10 PO) Take 1 capsule by mouth daily.    . progesterone (PROMETRIUM) 100 MG capsule Take 300 mg by mouth daily.    . riboflavin (VITAMIN B-2) 100 MG TABS tablet Take 100 mg by mouth daily.    . SUMAtriptan (IMITREX) 50 MG tablet Take 50 mg by mouth as directed.  1  . THYROID PO Take by mouth.    . Zinc 30 MG TABS Take by mouth.     No current facility-administered medications on file prior to visit.    Past Medical History:  Diagnosis Date  . Migraine     Past Surgical History:  Procedure Laterality Date  . BREAST SURGERY    . OTHER SURGICAL HISTORY     Various reconstruction from accident  Social History   Socioeconomic History  . Marital status: Married    Spouse name: Greggory Stallion "Bud"  . Number of children: 2  . Years of education: Not on file  . Highest education level: Bachelor's degree (e.g., BA, AB, BS)  Occupational History  . 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 Smoking status: Never Smoker  . Smokeless tobacco: Never Used  Substance and Sexual Activity  . Alcohol use: Yes    Comment: maybe twice per month  . Drug use: No  . Sexual activity: Not on file  Other Topics Concern  . Not on file  Social History Narrative   Patient is right-handed. She lives with her husband and 2 teenagers in a 2 story house. They have one dog. Patient drinks 1 cup of coffee a day. She walks 2-3 miles, goes to the gym or  does yoga daily.    Social Determinants of Health   Financial Resource Strain:   . Difficulty of Paying Living Expenses: Not on file  Food Insecurity:   . Worried About Programme researcher, broadcasting/film/video in the Last Year: Not on file  . Ran Out of Food in the Last Year: Not on file  Transportation Needs:   . Lack of Transportation (Medical): Not on file  . Lack of Transportation (Non-Medical): Not on file  Physical Activity:   . Days of Exercise per Week: Not on file  . Minutes of Exercise per Session: Not on file  Stress:   . Feeling of Stress : Not on file  Social Connections:   . Frequency of Communication with Friends and Family: Not on file  . Frequency of Social Gatherings with Friends and Family: Not on file  . Attends Religious Services: Not on file  . Active Member of Clubs or Organizations: Not on file  . Attends Banker Meetings: Not on file  . Marital Status: Not on file    Family History  Problem Relation Age of Onset  . Depression Mother   . Hypertension Father   . Diabetes Father     Review of Systems  Constitutional: Negative for fever.  Respiratory: Negative for cough, shortness of breath and wheezing.   Cardiovascular: Negative for chest pain, palpitations and leg swelling.  Gastrointestinal: Positive for constipation. Negative for abdominal pain.  Musculoskeletal: Positive for back pain (upper back) and neck pain.  Neurological: Positive for headaches. Negative for dizziness and light-headedness.  Psychiatric/Behavioral: The patient is nervous/anxious (mild).        Objective:   Vitals:   07/06/20 1522  BP: 106/72  Pulse: 76  Temp: 98.1 F (36.7 C)  SpO2: 99%   Filed Weights   07/06/20 1522  Weight: 137 lb (62.1 kg)   Body mass index is 24.27 kg/m.  BP Readings from Last 3 Encounters:  07/06/20 106/72  06/02/20 110/80  04/15/20 128/80    Wt Readings from Last 3 Encounters:  07/06/20 137 lb (62.1 kg)  06/02/20 137 lb (62.1 kg)   04/15/20 139 lb (63 kg)     Physical Exam Constitutional: She appears well-developed and well-nourished. No distress.  HENT:  Head: Normocephalic and atraumatic.  Right Ear: External ear normal. Normal ear canal and TM Left Ear: External ear normal.  Normal ear canal and TM Mouth/Throat: Oropharynx is clear and moist.  Eyes: Conjunctivae and EOM are normal.  Neck: Neck supple. No tracheal deviation present. No thyromegaly present.  No carotid bruit  Cardiovascular: Normal rate, regular rhythm and normal heart sounds.   No murmur heard.  No edema. Pulmonary/Chest: Effort normal and breath sounds normal. No respiratory distress. She has no wheezes. She has no rales.  Abdominal: Soft. She exhibits no distension. There is no tenderness.  Lymphadenopathy: She has no cervical adenopathy.  Skin: Skin is warm and dry. She is not diaphoretic.  Psychiatric: She has a normal mood and affect. Her behavior is normal.        Assessment & Plan:     See Problem List for Assessment and Plan of chronic medical problems.

## 2020-07-06 NOTE — Patient Instructions (Addendum)
Madison [name] - migraine headache, acute 50 mg as needed, may repeat dose x1 after 2h  Common Reactions: nausea somnolence xerostomia    [facility]  - Dr Edward Jolly, Dr Hyacinth Meeker and Dr Oscar La [address]  [address]  [place], [place] [address]  Main: [phone]   A referral was ordered for [facility] - Dr Jenita Seashore.      Try methocarbamol 500 mg up to 4 times a day as needed.  This was sent to your pharmacy.     Follow up in 6 months

## 2020-07-06 NOTE — Assessment & Plan Note (Signed)
Chronic, intermittent Benign Continue gargles

## 2020-07-13 DIAGNOSIS — R519 Headache, unspecified: Secondary | ICD-10-CM | POA: Diagnosis not present

## 2020-07-13 DIAGNOSIS — M542 Cervicalgia: Secondary | ICD-10-CM | POA: Diagnosis not present

## 2020-07-13 NOTE — Progress Notes (Signed)
[name] [address] [place] [address] Phone: [phone] Subjective:   Madison [name], am serving as a scribe for Dr. Antoine Primas. This visit occurred during the SARS-CoV-2 public health emergency.  Safety protocols were in place, including screening questions prior to the visit, additional usage of staff PPE, and extensive cleaning of exam room while observing appropriate contact time as indicated for disinfecting solutions.   I'm seeing this patient by the request  of:  Madison Sanes, MD  CC: neck and back pain   [id number]  Madison [name] is a 49 y.o. female coming in with complaint of back and neck pain. OMT 06/02/2020. Patient states that she had an acute spasm and had dry needling to release muscle. Continues to have migraines. Using Ubrevely for migraines 2x since Friday.   Medications patient has been prescribed: Duexis Taking: Intermittently         Reviewed prior external information including notes and imaging from previsou exam, outside providers and external EMR if available.   As well as notes that were available from care everywhere and other healthcare systems.  Past medical history, social, surgical and family history all reviewed in electronic medical record.  No pertanent information unless stated regarding to the chief complaint.   Past Medical History:  Diagnosis Date  . Migraine     Allergies  Allergen Reactions  . Lactose Intolerance (Gi)     Gives her migraines     Review of Systems:  No visual changes, nausea, vomiting, diarrhea, constipation, dizziness, abdominal pain, skin rash, fevers, chills, night sweats, weight loss, swollen lymph nodes, body aches, joint swelling, chest pain, shortness of breath, mood changes. POSITIVE muscle aches, headache  Objective  Blood pressure 122/82, pulse 85, height 5\' 3"  (1.6 m), weight 136 lb (61.7 kg), SpO2 98 %.   General: No apparent distress alert and  oriented x3 mood and affect normal, dressed appropriately.  HEENT: Pupils equal, extraocular movements intact  Respiratory: Patient's speak in full sentences and does not appear short of breath  Cardiovascular: No lower extremity edema, non tender, no erythema  Neuro: Cranial nerves II through XII are intact, neurovascularly intact in all extremities with 2+ DTRs and 2+ pulses.  Gait normal with good balance and coordination.  MSK:  Non tender with full range of motion and good stability and symmetric strength and tone of shoulders, elbows, wrist, hip, knee and ankles bilaterally.  Neck exam does have some mild loss of lordosis.  Some tenderness to palpation in the paraspinal musculature.  Patient does appear to be uncomfortable.  Osteopathic findings  C6 flexed rotated and side bent left T3 extended rotated and side bent right inhaled rib T11 extended rotated and side bent left        Assessment and Plan:  Cervicogenic headache Chronic but mild exacerbation, discussed HEP, discussed which activities to do, discussed posture, HEP started have a migraine given Toradol and Depo-Medrol today. RTC in 4-8 weeks     Nonallopathic problems  Decision today to treat with OMT was based on Physical Exam  After verbal consent patient was treated with HVLA, ME, FPR techniques in cervical, rib, thoracic areas  Patient tolerated the procedure well with improvement in symptoms  Patient given exercises, stretches and lifestyle modifications  See medications in patient instructions if given  Patient will follow up in 4-8 weeks      The above documentation has been reviewed and is accurate and complete  Tamala Julian, DO       Note: This dictation was prepared with Dragon dictation along with smaller phrase technology. Any transcriptional errors that result from this process are unintentional.

## 2020-07-14 ENCOUNTER — Ambulatory Visit (INDEPENDENT_AMBULATORY_CARE_PROVIDER_SITE_OTHER): Payer: BLUE CROSS/BLUE SHIELD | Admitting: Family Medicine

## 2020-07-14 ENCOUNTER — Encounter: Payer: Self-pay | Admitting: Family Medicine

## 2020-07-14 ENCOUNTER — Other Ambulatory Visit: Payer: Self-pay

## 2020-07-14 VITALS — BP 122/82 | HR 85 | Ht 63.0 in | Wt 136.0 lb

## 2020-07-14 DIAGNOSIS — M999 Biomechanical lesion, unspecified: Secondary | ICD-10-CM

## 2020-07-14 DIAGNOSIS — G4486 Cervicogenic headache: Secondary | ICD-10-CM | POA: Diagnosis not present

## 2020-07-14 MED ORDER — METHYLPREDNISOLONE ACETATE 40 MG/ML IJ SUSP
40.0000 mg | Freq: Once | INTRAMUSCULAR | Status: AC
Start: 2020-07-14 — End: 2020-07-14
  Administered 2020-07-14: 40 mg via INTRAMUSCULAR

## 2020-07-14 MED ORDER — KETOROLAC TROMETHAMINE 30 MG/ML IJ SOLN
30.0000 mg | Freq: Once | INTRAMUSCULAR | Status: AC
Start: 2020-07-14 — End: 2020-07-14
  Administered 2020-07-14: 30 mg via INTRAMUSCULAR

## 2020-07-14 NOTE — Assessment & Plan Note (Addendum)
Chronic but mild exacerbation, discussed HEP, discussed which activities to do, discussed posture, HEP started have a migraine given Toradol and Depo-Medrol today. RTC in 4-8 weeks  Patient does have some increasing anxiety that I think is also contributing to some of the discomfort and pain.  Will consider discussing this with patient at next follow-up

## 2020-07-14 NOTE — Patient Instructions (Signed)
Injections in back side today Good to see you I understand it's a lot  Tried manipulation  See me in 4-5 weeks

## 2020-08-03 DIAGNOSIS — E039 Hypothyroidism, unspecified: Secondary | ICD-10-CM | POA: Diagnosis not present

## 2020-08-03 DIAGNOSIS — R519 Headache, unspecified: Secondary | ICD-10-CM | POA: Diagnosis not present

## 2020-08-03 DIAGNOSIS — R5383 Other fatigue: Secondary | ICD-10-CM | POA: Diagnosis not present

## 2020-08-03 DIAGNOSIS — R5381 Other malaise: Secondary | ICD-10-CM | POA: Diagnosis not present

## 2020-08-12 ENCOUNTER — Other Ambulatory Visit: Payer: Self-pay | Admitting: Internal Medicine

## 2020-08-12 MED ORDER — UBRELVY 50 MG PO TABS: ORAL_TABLET | ORAL | 11 refills | Status: DC

## 2020-08-19 ENCOUNTER — Encounter: Payer: Self-pay | Admitting: Family Medicine

## 2020-08-19 ENCOUNTER — Other Ambulatory Visit: Payer: Self-pay

## 2020-08-19 ENCOUNTER — Ambulatory Visit (INDEPENDENT_AMBULATORY_CARE_PROVIDER_SITE_OTHER): Payer: BLUE CROSS/BLUE SHIELD | Admitting: Family Medicine

## 2020-08-19 VITALS — BP 110/80 | HR 72 | Ht 63.0 in | Wt 136.0 lb

## 2020-08-19 DIAGNOSIS — G4486 Cervicogenic headache: Secondary | ICD-10-CM | POA: Diagnosis not present

## 2020-08-19 DIAGNOSIS — M999 Biomechanical lesion, unspecified: Secondary | ICD-10-CM | POA: Diagnosis not present

## 2020-08-19 MED ORDER — PREDNISONE 20 MG PO TABS: 20.0000 mg | ORAL_TABLET | Freq: Every day | ORAL | 0 refills | Status: DC

## 2020-08-19 MED ORDER — GABAPENTIN 100 MG PO CAPS
200.0000 mg | ORAL_CAPSULE | Freq: Every day | ORAL | 3 refills | Status: DC
Start: 2020-08-19 — End: 2021-01-06

## 2020-08-19 NOTE — Patient Instructions (Signed)
Good to see you  Gabapentin 200mg  at night  prednisone daily starting tomorrow for 5 days.  Stay active See me again in 4-5 weeks Happy holidays!

## 2020-08-19 NOTE — Progress Notes (Signed)
[name] [address] [place] [address] Phone: [phone] Subjective:   Madison [name], am serving as a scribe for Dr. Antoine Primas. This visit occurred during the SARS-CoV-2 public health emergency.  Safety protocols were in place, including screening questions prior to the visit, additional usage of staff PPE, and extensive cleaning of exam room while observing appropriate contact time as indicated for disinfecting solutions.   I'm seeing this patient by the request  of:  Pincus Sanes, MD  CC: Neck pain and headache follow-up  [id number]  Madison [name] is a 49 y.o. female coming in with complaint of back and neck pain. OMT 07/14/2020. Patient states that she has been having increase in pain due to moving furniture.   Medications patient has been prescribed: Gabapentin, Duexis  Taking: No         Reviewed prior external information including notes and imaging from previsou exam, outside providers and external EMR if available.   As well as notes that were available from care everywhere and other healthcare systems.  Past medical history, social, surgical and family history all reviewed in electronic medical record.  No pertanent information unless stated regarding to the chief complaint.   Past Medical History:  Diagnosis Date  . Migraine     Allergies  Allergen Reactions  . Lactose Intolerance (Gi)     Gives her migraines     Review of Systems:  No  visual changes, nausea, vomiting, diarrhea, constipation, dizziness, abdominal pain, skin rash, fevers, chills, night sweats, weight loss, swollen lymph nodes, body aches, joint swelling, chest pain, shortness of breath, mood changes. POSITIVE muscle aches, headache  Objective  There were no vitals taken for this visit.   General: No apparent distress alert and oriented x3 mood and affect normal, dressed appropriately.  HEENT: Pupils equal, extraocular movements  intact  Respiratory: Patient's speak in full sentences and does not appear short of breath  Cardiovascular: No lower extremity edema, non tender, no erythema  Neuro: Cranial nerves II through XII are intact, neurovascularly intact in all extremities with 2+ DTRs and 2+ pulses.  Gait normal with good balance and coordination.  MSK:  Non tender with full range of motion and good stability and symmetric strength and tone of shoulders, elbows, wrist, hip, knee and ankles bilaterally.  Back -tenderness noted in the paraspinal musculature of the trapezius noted.  Patient does have some tightness of the neck with side bending to the right and rotation to the left.  Osteopathic findings  C6 flexed rotated and side bent left T3 extended rotated and side bent left inhaled rib T8 extended rotated and side bent left L1 flexed rotated and side bent right Sacrum right on right       Assessment and Plan:  No problem-specific Assessment & Plan notes found for this encounter.    Nonallopathic problems  Decision today to treat with OMT was based on Physical Exam  After verbal consent patient was treated with HVLA, ME, FPR techniques in cervical, rib, thoracic areas  Patient tolerated the procedure well with improvement in symptoms  Patient given exercises, stretches and lifestyle modifications  See medications in patient instructions if given  Patient will follow up in 4-8 weeks      The above documentation has been reviewed and is accurate and complete Madison Saa, DO       Note: This dictation was prepared with Dragon dictation along with smaller phrase technology. Any transcriptional  errors that result from this process are unintentional.

## 2020-08-20 ENCOUNTER — Encounter: Payer: Self-pay | Admitting: Family Medicine

## 2020-08-20 NOTE — Assessment & Plan Note (Signed)
Chronic problem.  Seems to be having a mild exacerbation.  Discussed medications including the gabapentin and Duexis.  We discussed the possibility of trigger point injections.  Patient though did respond fairly well to osteopathic manipulation.  Increase activity slowly.  Follow-up with me again in 4 to 8 weeks

## 2020-08-24 DIAGNOSIS — H52223 Regular astigmatism, bilateral: Secondary | ICD-10-CM | POA: Diagnosis not present

## 2020-09-02 DIAGNOSIS — E039 Hypothyroidism, unspecified: Secondary | ICD-10-CM | POA: Diagnosis not present

## 2020-09-02 DIAGNOSIS — N943 Premenstrual tension syndrome: Secondary | ICD-10-CM | POA: Diagnosis not present

## 2020-09-02 DIAGNOSIS — R5381 Other malaise: Secondary | ICD-10-CM | POA: Diagnosis not present

## 2020-09-02 DIAGNOSIS — M545 Low back pain, unspecified: Secondary | ICD-10-CM | POA: Diagnosis not present

## 2020-09-17 DIAGNOSIS — Z1231 Encounter for screening mammogram for malignant neoplasm of breast: Secondary | ICD-10-CM | POA: Diagnosis not present

## 2020-09-18 ENCOUNTER — Institutional Professional Consult (permissible substitution): Payer: BLUE CROSS/BLUE SHIELD | Admitting: Plastic Surgery

## 2020-09-22 ENCOUNTER — Institutional Professional Consult (permissible substitution): Payer: BLUE CROSS/BLUE SHIELD | Admitting: Plastic Surgery

## 2020-09-24 ENCOUNTER — Ambulatory Visit: Payer: BLUE CROSS/BLUE SHIELD | Admitting: Family Medicine

## 2020-10-06 ENCOUNTER — Institutional Professional Consult (permissible substitution): Payer: BC Managed Care – PPO | Admitting: Plastic Surgery

## 2020-10-12 NOTE — Progress Notes (Unsigned)
[name] [address] [place] [address] Phone: [phone] Subjective:   I Madison [name] am serving as a Neurosurgeon for Dr. Antoine Primas.  This visit occurred during the SARS-CoV-2 public health emergency.  Safety protocols were in place, including screening questions prior to the visit, additional usage of staff PPE, and extensive cleaning of exam room while observing appropriate contact time as indicated for disinfecting solutions.   I'm seeing this patient by the request  of:  Pincus Sanes, MD  CC: Neck pain and headache follow-up  [id number]  Madison [name] is a 50 y.o. female coming in with complaint of back and neck pain. OMT 08/19/2020. Patient states she has left sided upper trap pain. Hasn't had any bad migraines.  Overall doing relatively well.  Only having the tightness on the left side at this moment.  Patient states that otherwise she has been feeling pretty good.  Medications patient has been prescribed: Gabapentin  Taking: Intermittently         Reviewed prior external information including notes and imaging from previsou exam, outside providers and external EMR if available.   As well as notes that were available from care everywhere and other healthcare systems.  Past medical history, social, surgical and family history all reviewed in electronic medical record.  No pertanent information unless stated regarding to the chief complaint.   Past Medical History:  Diagnosis Date  . Migraine     Allergies  Allergen Reactions  . Lactose Intolerance (Gi)     Gives her migraines     Review of Systems:  No headache, visual changes, nausea, vomiting, diarrhea, constipation, dizziness, abdominal pain, skin rash, fevers, chills, night sweats, weight loss, swollen lymph nodes, body aches, joint swelling, chest pain, shortness of breath, mood changes. POSITIVE muscle aches  Objective  Blood pressure 126/82, pulse 83,  height 5\' 3"  (1.6 m), weight 140 lb (63.5 kg), SpO2 100 %.   General: No apparent distress alert and oriented x3 mood and affect normal, dressed appropriately.  HEENT: Pupils equal, extraocular movements intact  Respiratory: Patient's speak in full sentences and does not appear short of breath  Cardiovascular: No lower extremity edema, non tender, no erythema  Neuro: Cranial nerves II through XII are intact, neurovascularly intact in all extremities with 2+ DTRs and 2+ pulses.  Gait normal with good balance and coordination.  MSK:  Non tender with full range of motion and good stability and symmetric strength and tone of shoulders, elbows, wrist, hip, knee and ankles bilaterally.  Neck exam shows some mild tightness on the left side.  Tightness of the left trapezius noted with multiple trigger points.  Patient does have some muscle tenderness in the parascapular region left greater than right.  Osteopathic findings  C6 flexed rotated and side bent left T3 extended rotated and side bent left inhaled rib       Assessment and Plan:  Cervicogenic headache Patient has been doing remarkably well overall.  Patient is only taking medications more on an as-needed basis.  We discussed possible ergonomics, and discussed proper lifting mechanics.  Increase activity slowly.  Follow-up with me again 2 months    Nonallopathic problems  Decision today to treat with OMT was based on Physical Exam  After verbal consent patient was treated with HVLA, ME, FPR techniques in cervical, rib, thoracic  areas  Patient tolerated the procedure well with improvement in symptoms  Patient given exercises, stretches and lifestyle modifications  See  medications in patient instructions if given  Patient will follow up in 8 weeks      The above documentation has been reviewed and is accurate and complete Judi Saa, DO       Note: This dictation was prepared with Dragon dictation along with smaller  phrase technology. Any transcriptional errors that result from this process are unintentional.

## 2020-10-13 ENCOUNTER — Other Ambulatory Visit: Payer: Self-pay

## 2020-10-13 ENCOUNTER — Encounter: Payer: Self-pay | Admitting: Family Medicine

## 2020-10-13 ENCOUNTER — Ambulatory Visit (INDEPENDENT_AMBULATORY_CARE_PROVIDER_SITE_OTHER): Payer: BC Managed Care – PPO | Admitting: Family Medicine

## 2020-10-13 VITALS — BP 126/82 | HR 83 | Ht 63.0 in | Wt 140.0 lb

## 2020-10-13 DIAGNOSIS — M999 Biomechanical lesion, unspecified: Secondary | ICD-10-CM | POA: Diagnosis not present

## 2020-10-13 DIAGNOSIS — G4486 Cervicogenic headache: Secondary | ICD-10-CM

## 2020-10-13 NOTE — Patient Instructions (Signed)
Good to see you Doing great! See me again in 8 weeks

## 2020-10-13 NOTE — Assessment & Plan Note (Signed)
Patient has been doing remarkably well overall.  Patient is only taking medications more on an as-needed basis.  We discussed possible ergonomics, and discussed proper lifting mechanics.  Increase activity slowly.  Follow-up with me again 2 months

## 2020-11-04 DIAGNOSIS — R5383 Other fatigue: Secondary | ICD-10-CM | POA: Diagnosis not present

## 2020-11-04 DIAGNOSIS — E039 Hypothyroidism, unspecified: Secondary | ICD-10-CM | POA: Diagnosis not present

## 2020-11-04 DIAGNOSIS — R519 Headache, unspecified: Secondary | ICD-10-CM | POA: Diagnosis not present

## 2020-11-04 DIAGNOSIS — R5381 Other malaise: Secondary | ICD-10-CM | POA: Diagnosis not present

## 2020-11-19 DIAGNOSIS — H52223 Regular astigmatism, bilateral: Secondary | ICD-10-CM | POA: Diagnosis not present

## 2020-11-19 DIAGNOSIS — H5032 Intermittent alternating esotropia: Secondary | ICD-10-CM | POA: Diagnosis not present

## 2020-11-24 ENCOUNTER — Encounter: Payer: Self-pay | Admitting: Plastic Surgery

## 2020-11-24 ENCOUNTER — Other Ambulatory Visit: Payer: Self-pay

## 2020-11-24 ENCOUNTER — Ambulatory Visit (INDEPENDENT_AMBULATORY_CARE_PROVIDER_SITE_OTHER): Payer: BC Managed Care – PPO | Admitting: Plastic Surgery

## 2020-11-24 VITALS — BP 132/91 | Temp 98.0°F | Resp 16 | Ht 64.0 in | Wt 139.0 lb

## 2020-11-24 DIAGNOSIS — M546 Pain in thoracic spine: Secondary | ICD-10-CM

## 2020-11-24 DIAGNOSIS — M549 Dorsalgia, unspecified: Secondary | ICD-10-CM | POA: Insufficient documentation

## 2020-11-24 DIAGNOSIS — R519 Headache, unspecified: Secondary | ICD-10-CM

## 2020-11-24 DIAGNOSIS — M5412 Radiculopathy, cervical region: Secondary | ICD-10-CM

## 2020-11-24 DIAGNOSIS — G8929 Other chronic pain: Secondary | ICD-10-CM | POA: Diagnosis not present

## 2020-11-24 NOTE — Progress Notes (Signed)
Patient ID: Madison [name], female    DOB: 1970/12/21, 50 y.o.   MRN: [id number]   Chief Complaint  Patient presents with  . Advice Only  . Breast Problem    The patient is a 50 year old female here for evaluation of her breasts.  She underwent implant placement in 2009 by Dr. Delford Field in [place] [phone]  He has since retired.  She thinks that they are silicone implants but not sure about the size.  She thinks they are under the muscle.  She was a B cup prior to the surgery and she is now DD cup.  She complains of back and neck pain that she thinks may be related to the implants.  She has been going to a sports medicine physician which is not alleviating the pain.  She is 5 feet 4 inches tall weighs 139 pounds.  She does not have any concerns about her breasts otherwise.  She had her last mammogram in December and it was negative.  She originally had them put in because she had ptosis after breast-feeding her children.  She has a little bit of ptosis now.  The implants are soft with the left implant is a little bit firmer than the right.  The skin is intact.  There is a nice healed scar that circumferential to the nipple areolar complex.  She has a history of blood dyscrasia.   Review of Systems  Constitutional: Negative.   HENT: Negative.   Eyes: Negative.   Respiratory: Negative.   Cardiovascular: Negative.  Negative for leg swelling.  Gastrointestinal: Negative.  Negative for abdominal distention.  Genitourinary: Negative.   Musculoskeletal: Positive for back pain and neck pain.  Hematological: Negative.   Psychiatric/Behavioral: Negative.     Past Medical History:  Diagnosis Date  . Migraine     Past Surgical History:  Procedure Laterality Date  . BREAST SURGERY    . OTHER SURGICAL HISTORY     Various reconstruction from accident      Current Outpatient Medications:  .  ARMOUR THYROID 30 MG tablet, Take 30 mg by mouth daily., Disp: , Rfl:  .  ARMOUR  THYROID 60 MG tablet, Take by mouth., Disp: , Rfl:  .  BACLOFEN PO, Take 10 mg by mouth., Disp: , Rfl:  .  Biotin 5000 MCG CAPS, Take by mouth., Disp: , Rfl:  .  Cholecalciferol (VITAMIN D3) 1000 units CAPS, Take 1 capsule by mouth daily., Disp: , Rfl:  .  gabapentin (NEURONTIN) 100 MG capsule, Take 2 capsules (200 mg total) by mouth at bedtime., Disp: 60 capsule, Rfl: 3 .  Ibuprofen-Famotidine (DUEXIS) 800-26.6 MG TABS, Take 800 mg by mouth 3 (three) times daily., Disp: 90 tablet, Rfl: 3 .  Ibuprofen-Famotidine (DUEXIS) 800-26.6 MG TABS, Take 1 tablet by mouth 3 (three) times daily as needed., Disp: 90 tablet, Rfl: 3 .  MAGNESIUM MALATE PO, Take by mouth., Disp: , Rfl:  .  Menaquinone-7 (VITAMIN K2) 100 MCG CAPS, Take 1 capsule by mouth daily., Disp: , Rfl:  .  methocarbamol (ROBAXIN) 500 MG tablet, Take 1 tablet (500 mg total) by mouth every 6 (six) hours as needed for muscle spasms., Disp: 30 tablet, Rfl: 2 .  Multiple Vitamin (MULTIVITAMIN) tablet, Take 1 tablet by mouth daily., Disp: , Rfl:  .  predniSONE (DELTASONE) 20 MG tablet, Take 1 tablet (20 mg total) by mouth daily with breakfast., Disp: 5 tablet, Rfl: 0 .  Probiotic Product (PROBIOTIC-10 PO), Take 1 capsule by  mouth daily., Disp: , Rfl:  .  progesterone (PROMETRIUM) 100 MG capsule, Take 300 mg by mouth daily., Disp: , Rfl:  .  riboflavin (VITAMIN B-2) 100 MG TABS tablet, Take 100 mg by mouth daily., Disp: , Rfl:  .  THYROID PO, Take by mouth., Disp: , Rfl:  .  Ubrogepant (UBRELVY) 50 MG TABS, Take 50 mg once as needed for migraine, may repeat x 1 after 2 hrs, Disp: 10 tablet, Rfl: 11 .  Zinc 30 MG TABS, Take by mouth., Disp: , Rfl:    Objective:   Vitals:   11/24/20 1031  BP: (!) 132/91  Resp: 16  Temp: 98 F (36.7 C)  SpO2: 98%    Physical Exam Vitals and nursing note reviewed.  Constitutional:      Appearance: Normal appearance.  HENT:     Head: Normocephalic and atraumatic.  Cardiovascular:     Rate and Rhythm:  Normal rate.     Pulses: Normal pulses.  Pulmonary:     Effort: Pulmonary effort is normal. No respiratory distress.  Abdominal:     General: Abdomen is flat. There is no distension.     Tenderness: There is no abdominal tenderness.  Neurological:     General: No focal deficit present.     Mental Status: She is alert and oriented to person, place, and time.  Psychiatric:        Mood and Affect: Mood normal.        Behavior: Behavior normal.        Thought Content: Thought content normal.     Assessment & Plan:  Chronic nonintractable headache, unspecified headache type  Cervical radiculopathy  Chronic bilateral thoracic back pain  Patient would like to move ahead with removal of breast implants without replacement.  She is thinking about mastopexy either at the same time for delayed.  Most likely delayed but she still been to think about it.  We will provide her with a quote.  Pictures were obtained of the patient and placed in the chart with the patient's or guardian's permission.   Alena Bills Marlynn Hinckley, DO

## 2020-12-04 NOTE — Progress Notes (Signed)
[name] [address] [place] [address] Phone: [phone] Subjective:   Bruce Donath, am serving as a scribe for Dr. Antoine Primas. This visit occurred during the SARS-CoV-2 public health emergency.  Safety protocols were in place, including screening questions prior to the visit, additional usage of staff PPE, and extensive cleaning of exam room while observing appropriate contact time as indicated for disinfecting solutions.   I'm seeing this patient by the request  of:  Pincus Sanes, MD  CC: Back and neck pain  [id number]  Madison [name] is a 50 y.o. female coming in with complaint of back and neck pain. OMT 10/13/2020. Patient states that she is having radicular symptoms that start in left trap and scapula. Patient wonders if she has arthritis in her hands due to tightness.  Patient has been doing a little bit more manual labor recently.  Thinks that this could be contributing.  Just does not want to start getting the headaches again.  Denies any radiation to any of the extremities.  Just the tightness more in the neck.  Medications patient has been prescribed: None  Taking:         Reviewed prior external information including notes and imaging from previsou exam, outside providers and external EMR if available.   As well as notes that were available from care everywhere and other healthcare systems.  Past medical history, social, surgical and family history all reviewed in electronic medical record.  No pertanent information unless stated regarding to the chief complaint.   Past Medical History:  Diagnosis Date  . Migraine     Allergies  Allergen Reactions  . Lactose Intolerance (Gi)     Gives her migraines     Review of Systems:  No  visual changes, nausea, vomiting, diarrhea, constipation, dizziness, abdominal pain, skin rash, fevers, chills, night sweats, weight loss, swollen lymph nodes, body aches, joint  swelling, chest pain, shortness of breath, mood changes. POSITIVE muscle aches, headache  Objective  Blood pressure 118/86, pulse 84, height 5\' 4"  (1.626 m), weight 139 lb (63 kg), last menstrual period 11/11/2020, SpO2 98 %.   General: No apparent distress alert and oriented x3 mood and affect normal, dressed appropriately.  HEENT: Pupils equal, extraocular movements intact  Respiratory: Patient's speak in full sentences and does not appear short of breath  Cardiovascular: No lower extremity edema, non tender, no erythema  Gait normal with good balance and coordination.  MSK:  Non tender with full range of motion and good stability and symmetric strength and tone of shoulders, elbows, wrist, hip, knee and ankles bilaterally.  Back - Normal skin, Spine with normal alignment and no deformity.  No tenderness to vertebral process palpation.  Paraspinous muscles are not tender and without spasm.   Range of motion is full at neck and lumbar sacral regions  Osteopathic findings  C2 flexed rotated and side bent right C6 flexed rotated and side bent left T3 extended rotated and side bent right inhaled rib T9 extended rotated and side bent left       Assessment and Plan:  Cervicogenic headache Increasing tightness in the upper thoracic spine.  Patient did have breast implants and likely are somewhat contributing to some of this as well.  Patient did see plastic surgery but may not be able to have them removed.  Patient did respond extremely well to osteopathic manipulation.  Discussed patient's different medications again at this time.  We discussed the potential  for laboratory work-up which patient will actually send [place] some from her integrative therapist and then decide if we need to move forward with ours.  X-rays of the neck and wrist will be ordered today with him being nearly 50 years old.  Follow-up with me again in 4 to 5 weeks    Nonallopathic problems  Decision today to treat with OMT  was based on Physical Exam  After verbal consent patient was treated with HVLA, ME, FPR techniques in cervical, rib, thoracic, areas  Patient tolerated the procedure well with improvement in symptoms  Patient given exercises, stretches and lifestyle modifications  See medications in patient instructions if given  Patient will follow up in 4-8 weeks      The above documentation has been reviewed and is accurate and complete Judi Saa, DO       Note: This dictation was prepared with Dragon dictation along with smaller phrase technology. Any transcriptional errors that result from this process are unintentional.

## 2020-12-07 ENCOUNTER — Ambulatory Visit (INDEPENDENT_AMBULATORY_CARE_PROVIDER_SITE_OTHER): Payer: BC Managed Care – PPO

## 2020-12-07 ENCOUNTER — Other Ambulatory Visit: Payer: Self-pay

## 2020-12-07 ENCOUNTER — Encounter: Payer: Self-pay | Admitting: Family Medicine

## 2020-12-07 ENCOUNTER — Ambulatory Visit (INDEPENDENT_AMBULATORY_CARE_PROVIDER_SITE_OTHER): Payer: BC Managed Care – PPO | Admitting: Family Medicine

## 2020-12-07 VITALS — BP 118/86 | HR 84 | Ht 64.0 in | Wt 139.0 lb

## 2020-12-07 DIAGNOSIS — M542 Cervicalgia: Secondary | ICD-10-CM

## 2020-12-07 DIAGNOSIS — G4486 Cervicogenic headache: Secondary | ICD-10-CM | POA: Diagnosis not present

## 2020-12-07 DIAGNOSIS — M999 Biomechanical lesion, unspecified: Secondary | ICD-10-CM

## 2020-12-07 DIAGNOSIS — M2550 Pain in unspecified joint: Secondary | ICD-10-CM

## 2020-12-07 NOTE — Patient Instructions (Addendum)
Xray today Labs are in computer but we can hold for today Send picture of labs from other doc See me in 4-5 weeks

## 2020-12-07 NOTE — Assessment & Plan Note (Signed)
Increasing tightness in the upper thoracic spine.  Patient did have breast implants and likely are somewhat contributing to some of this as well.  Patient did see plastic surgery but may not be able to have them removed.  Patient did respond extremely well to osteopathic manipulation.  Discussed patient's different medications again at this time.  We discussed the potential for laboratory work-up which patient will actually send [place] some from her integrative therapist and then decide if we need to move forward with ours.  X-rays of the neck and wrist will be ordered today with him being nearly 50 years old.  Follow-up with me again in 4 to 5 weeks

## 2021-01-05 ENCOUNTER — Encounter: Payer: Self-pay | Admitting: Internal Medicine

## 2021-01-05 NOTE — Progress Notes (Signed)
Subjective:    Patient ID: Madison [name], female    DOB: November 28, 1970, 50 y.o.   MRN: [id number]  HPI The patient is here for follow up of their chronic medical problems, including chronic headaches and chronic neck/upper back pain  Her chronic upper back and neck pain is one of the triggers for her headaches and migraines.  Her breast implants are likely contributing to her chronic upper back and neck pain.  She does have regular massages monthly and gets adjusted by sports medicine every 6 weeks.  She does stretches and does yoga.  She does take baclofen as needed, but tries not to take it too regularly.  She will take ibuprofen as needed, which helps.  Migraines: She has tried [place] and that does help.  She tolerates this better than the Imitrex.   She has had some irregularity in her periods and her progesterone was adjusted by her integrative doctor.  She has found that she is a little bit more emotional and has had more issues with remembering.  She is not sure what that is related to.  She has had some increased stress.  She does feel that she is sleeping well.  Medications and allergies reviewed with patient and updated if appropriate.  Patient Active Problem List   Diagnosis Date Noted  . Back pain 11/24/2020  . 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 Chronic headaches 07/06/2020  . Tonsil stone 07/06/2020  . Hematuria 02/26/2020  . Piriformis syndrome, left 02/26/2020  . Trigger point of shoulder region 12/20/2018  . Migraine 06/07/2018  . Cervical radiculopathy 03/15/2018  . 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 Nonallopathic lesion of thoracic region 02/28/2018  . Nonallopathic lesion of rib cage 02/28/2018  . Nonallopathic lesion of cervical region 02/28/2018    Current Outpatient Medications on File Prior to Visit  Medication Sig Dispense Refill  . ARMOUR THYROID 30 MG tablet Take 30 mg by mouth daily.    Mack Guise THYROID 60 MG tablet Take by mouth.    . baclofen  (LIORESAL) 10 MG tablet Take 10 mg by mouth daily.    . Biotin 5000 MCG CAPS Take by mouth.    . Cholecalciferol (VITAMIN D3) 1000 units CAPS Take 1 capsule by mouth daily.    . Ibuprofen-Famotidine (DUEXIS) 800-26.6 MG TABS Take 1 tablet by mouth 3 (three) times daily as needed. 90 tablet 3  . MAGNESIUM MALATE PO Take by mouth.    . Menaquinone-7 (VITAMIN K2) 100 MCG CAPS Take 1 capsule by mouth daily.    . Multiple Vitamin (MULTIVITAMIN) tablet Take 1 tablet by mouth daily.    . Probiotic Product (PROBIOTIC-10 PO) Take 1 capsule by mouth daily.    . progesterone (PROMETRIUM) 100 MG capsule Take 300 mg by mouth daily.    . progesterone (PROMETRIUM) 100 MG capsule Take 100 mg by mouth at bedtime.    . RESTASIS 0.05 % ophthalmic emulsion     . riboflavin (VITAMIN B-2) 100 MG TABS tablet Take 100 mg by mouth daily.    . timolol (TIMOPTIC) 0.5 % ophthalmic solution SMARTSIG:In Eye(s)    . Ubrogepant (UBRELVY) 50 MG TABS Take 50 mg once as needed for migraine, may repeat x 1 after 2 hrs 10 tablet 11  . Zinc 30 MG TABS Take by mouth.     No current facility-administered medications on file prior to visit.    Past Medical History:  Diagnosis Date  . Migraine     Past Surgical History:  Procedure Laterality Date  . BREAST SURGERY    . OTHER SURGICAL HISTORY     Various reconstruction from accident    Social History   Socioeconomic History  . Marital status: Married    Spouse name: Greggory Stallion "Bud"  . Number of children: 2  . Years of education: Not on file  . Highest education level: Bachelor's degree (e.g., BA, AB, BS)  Occupational History  . 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 Smoking status: Never Smoker  . Smokeless tobacco: Never Used  Substance and Sexual Activity  . Alcohol use: Yes    Comment: maybe twice per month  . Drug use: No  . Sexual activity: Not on file  Other Topics Concern  . Not on file  Social History Narrative    Patient is right-handed. She lives with her husband and 2 teenagers in a 2 story house. They have one dog. Patient drinks 1 cup of coffee a day. She walks 2-3 miles, goes to the gym or does yoga daily.    Social Determinants of Health   Financial Resource Strain: Not on file  Food Insecurity: Not on file  Transportation Needs: Not on file  Physical Activity: Not on file  Stress: Not on file  Social Connections: Not on file    Family History  Problem Relation Age of Onset  . Depression Mother   . Hypertension Father   . Diabetes Father     Review of Systems  Constitutional: Negative for fever.  Musculoskeletal: Positive for arthralgias, back pain, neck pain and neck stiffness.  Neurological: Positive for headaches.  Psychiatric/Behavioral: Negative for sleep disturbance. The patient is nervous/anxious.        Objective:   Vitals:   01/06/21 1054  BP: 116/74  Pulse: 87  Temp: 98.3 F (36.8 C)  SpO2: 99%   BP Readings from Last 3 Encounters:  01/06/21 116/74  12/07/20 118/86  11/24/20 (!) 132/91   Wt Readings from Last 3 Encounters:  01/06/21 141 lb (64 kg)  12/07/20 139 lb (63 kg)  11/24/20 139 lb (63 kg)   Body mass index is 24.2 kg/m.   Physical Exam    Constitutional: Appears well-developed and well-nourished. No distress.  Skin: Skin is warm and dry. Not diaphoretic.  Psychiatric: Normal mood and affect. Behavior is normal.      Assessment & Plan:    See Problem List for Assessment and Plan of chronic medical problems.    This visit occurred during the SARS-CoV-2 public health emergency.  Safety protocols were in place, including screening questions prior to the visit, additional usage of staff PPE, and extensive cleaning of exam room while observing appropriate contact time as indicated for disinfecting solutions.

## 2021-01-06 ENCOUNTER — Other Ambulatory Visit: Payer: Self-pay

## 2021-01-06 ENCOUNTER — Ambulatory Visit: Payer: BC Managed Care – PPO | Admitting: Internal Medicine

## 2021-01-06 VITALS — BP 116/74 | HR 87 | Temp 98.3°F | Ht 64.0 in | Wt 141.0 lb

## 2021-01-06 DIAGNOSIS — M549 Dorsalgia, unspecified: Secondary | ICD-10-CM

## 2021-01-06 DIAGNOSIS — G8929 Other chronic pain: Secondary | ICD-10-CM

## 2021-01-06 DIAGNOSIS — R519 Headache, unspecified: Secondary | ICD-10-CM

## 2021-01-06 DIAGNOSIS — G43511 Persistent migraine aura without cerebral infarction, intractable, with status migrainosus: Secondary | ICD-10-CM | POA: Diagnosis not present

## 2021-01-06 NOTE — Assessment & Plan Note (Signed)
Chronic, intermittent Did not tolerate Imitrex well Tolerating Ubrelvy better-we will continue Ubrelvy 50 mg as needed for migraines

## 2021-01-06 NOTE — Assessment & Plan Note (Signed)
Chronic Chronic upper back and neck pain Some of her pain is likely related to her breast implants which I do believe would be a good idea to have them removed Continue monthly massages, adjustments by Dr. Katrinka Blazing Baclofen as needed, ibuprofen as needed Stretching, yoga

## 2021-01-06 NOTE — Patient Instructions (Addendum)
call your insurance company.      Follow up annually

## 2021-01-06 NOTE — Assessment & Plan Note (Signed)
Chronic Continues to have chronic headaches and migraines Ubrelvy as needed for migraines Takes ibuprofen for other headaches/neck/back pain Gets massages once a month Gets adjusted by Dr. Katrinka Blazing every 6 weeks Continue regular stretching, yoga Continue baclofen as needed I do think she would benefit from having her breast implants removed since this is likely contributing to her chronic neck and back pain which is one of the major triggers for her headaches

## 2021-01-13 NOTE — Progress Notes (Signed)
[name] [address] [place] [address] Phone: [phone] Subjective:   Bruce Donath, am serving as a scribe for Dr. Antoine Primas. This visit occurred during the SARS-CoV-2 public health emergency.  Safety protocols were in place, including screening questions prior to the visit, additional usage of staff PPE, and extensive cleaning of exam room while observing appropriate contact time as indicated for disinfecting solutions.   I'm seeing this patient by the request  of:  Pincus Sanes, MD  CC: Neck and headache follow-up  [id number]  Ivis Peele-Page is a 50 y.o. female coming in with complaint of back and neck pain. OMT 12/07/2020. Patient states that she notices inflammation in joints throughout her body. Has had increase in pain since last visit. Had 2 migraines this week. Pain in shoulders and neck from being at computer. Had to take migraine medication.  Patient states that he has had it for 2 days at this time now.  Medications patient has been prescribed: None  Taking:         Past medical history, social, surgical and family history all reviewed in electronic medical record.  No pertanent information unless stated regarding to the chief complaint.   Past Medical History:  Diagnosis Date  . Migraine     Allergies  Allergen Reactions  . Lactose Intolerance (Gi)     Gives her migraines     Review of Systems:  No  visual changes, nausea, vomiting, diarrhea, constipation, dizziness, abdominal pain, skin rash, fevers, chills, night sweats, weight loss, swollen lymph nodes, body aches, joint swelling, chest pain, shortness of breath, mood changes. POSITIVE muscle aches, headache  Objective  Blood pressure 132/84, pulse 82, height 5\' 4"  (1.626 m), weight 142 lb (64.4 kg), SpO2 99 %.   General: No apparent distress alert and oriented x3 mood and affect normal, dressed appropriately.  HEENT: Pupils equal, extraocular  movements intact  Respiratory: Patient's speak in full sentences and does not appear short of breath  Cardiovascular: No lower extremity edema, non tender, no erythema  Gait normal with good balance and coordination.  MSK:  Non tender with full range of motion and good stability and symmetric strength and tone of shoulders, elbows, wrist, hip, knee and ankles bilaterally.  Neck exam does show some mild loss of lordosis.  Patient does have tightness noted on the paraspinal musculature bilaterally.  Tightness noted in the right parascapular region as well.  Multiple trigger points noted.  Negative Spurling's though noted.  5-5 strength of the upper extremities.  Patient does have large amount of breast tissue that does cause patient to have anterior displacement of the shoulders.  Osteopathic findings  C2 flexed rotated and side bent right C6 flexed rotated and side bent left T3 extended rotated and side bent right inhaled rib T11 extended rotated and side bent left       Assessment and Plan:  Cervicogenic headache Chronic, with mild exacerbation at this point.  Patient did have unfortunately some increasing discomfort recently.  Patient does have some baclofen that she can take daily for some of the relief.  Patient though sometimes does need to Toradol and Depo-Medrol but today seemed to be doing okay.    Nonallopathic problems  Decision today to treat with OMT was based on Physical Exam  After verbal consent patient was treated with HVLA, ME, FPR techniques in cervical, rib, thoracic  areas  Patient tolerated the procedure well with improvement in symptoms  Patient given exercises, stretches and lifestyle modifications  See medications in patient instructions if given  Patient will follow up in 4-8 weeks      The above documentation has been reviewed and is accurate and complete Lyndal Pulley, DO       Note: This dictation was prepared with Dragon dictation along with  smaller phrase technology. Any transcriptional errors that result from this process are unintentional.

## 2021-01-14 ENCOUNTER — Encounter: Payer: Self-pay | Admitting: Family Medicine

## 2021-01-14 ENCOUNTER — Other Ambulatory Visit: Payer: Self-pay

## 2021-01-14 ENCOUNTER — Ambulatory Visit (INDEPENDENT_AMBULATORY_CARE_PROVIDER_SITE_OTHER): Payer: BC Managed Care – PPO | Admitting: Family Medicine

## 2021-01-14 VITALS — BP 132/84 | HR 82 | Ht 64.0 in | Wt 142.0 lb

## 2021-01-14 DIAGNOSIS — G4486 Cervicogenic headache: Secondary | ICD-10-CM | POA: Diagnosis not present

## 2021-01-14 DIAGNOSIS — M9902 Segmental and somatic dysfunction of thoracic region: Secondary | ICD-10-CM

## 2021-01-14 DIAGNOSIS — M9908 Segmental and somatic dysfunction of rib cage: Secondary | ICD-10-CM

## 2021-01-14 DIAGNOSIS — M9901 Segmental and somatic dysfunction of cervical region: Secondary | ICD-10-CM

## 2021-01-14 NOTE — Patient Instructions (Signed)
Good to see you Try to control the stress Use Ibuprofen for 3 days when needed  See me again in 6 weeks

## 2021-01-14 NOTE — Assessment & Plan Note (Signed)
Chronic, with mild exacerbation at this point.  Patient did have unfortunately some increasing discomfort recently.  Patient does have some baclofen that she can take daily for some of the relief.  Patient though sometimes does need to Toradol and Depo-Medrol but today seemed to be doing okay.

## 2021-02-23 NOTE — Progress Notes (Signed)
[name] [address] [place] [address] Phone: [phone] Subjective:   Bruce Donath, am serving as a scribe for Dr. Antoine Primas. This visit occurred during the SARS-CoV-2 public health emergency.  Safety protocols were in place, including screening questions prior to the visit, additional usage of staff PPE, and extensive cleaning of exam room while observing appropriate contact time as indicated for disinfecting solutions.   I'm seeing this patient by the request  of:  Pincus Sanes, MD  CC: back and neck pain   [id number]  Madison [name] is a 50 y.o. female coming in with complaint of back and neck pain. OMT 01/14/2021. Patient states that she started to have migraine last night and it continues today. Pain over L shoulder and into trap and L eye.  Patient feels like it is very similar to her previous headaches.  It is somewhat worsening.  Medications patient has been prescribed: None  Taking:         Reviewed prior external information including notes and imaging from previsou exam, outside providers and external EMR if available.   As well as notes that were available from care everywhere and other healthcare systems.  Past medical history, social, surgical and family history all reviewed in electronic medical record.  No pertanent information unless stated regarding to the chief complaint.   Past Medical History:  Diagnosis Date  . Migraine     Allergies  Allergen Reactions  . Lactose Intolerance (Gi)     Gives her migraines     Review of Systems:  No  visual changes, nausea, vomiting, diarrhea, constipation, dizziness, abdominal pain, skin rash, fevers, chills, night sweats, weight loss, swollen lymph nodes, body aches, joint swelling, chest pain, shortness of breath, mood changes. POSITIVE muscle aches, headache   Objective  Blood pressure 122/86, pulse 97, height 5\' 4"  (1.626 m), weight 140 lb (63.5 kg),  SpO2 99 %.   General: No apparent distress alert and oriented x3 mood and affect normal, dressed appropriately.  HEENT: Pupils equal, extraocular movements intact  Respiratory: Patient's speak in full sentences and does not appear short of breath  Cardiovascular: No lower extremity edema, non tender, no erythema  Gait normal with good balance and coordination.  MSK:  Non tender with full range of motion and good stability and symmetric strength and tone of shoulders, elbows, wrist, hip, knee and ankles bilaterally.  Back -seems to be tighter in the parascapular region on the left side.  Patient does have tightness in the left side of the neck as well.  Likely negative Spurling's test.  5 out of 5 strength of the upper extremity.  Osteopathic findings  C2 flexed rotated and side bent right C6 flexed rotated and side bent left T3 extended rotated and side bent left inhaled rib T10 extended rotated and side bent left        Assessment and Plan: Cervicogenic headache Patient does have a grade cervicogenic headaches and has had it for quite some time.  This is a chronic problem with exacerbation.  We will hold on any other type of anti-inflammatory and given Toradol which is helped her in the past.  Very low dose of methylprednisolone I think will also be beneficial.  Discussed with patient icing regimen exercises.  Increase activity slowly.  Patient will be having a breast reduction surgery in November that I think also may be helpful.  Follow-up with me in general in 4 to 5 weeks.  Patient knows if worsening pain or headache she needs to seek medical attention immediately.     Nonallopathic problems  Decision today to treat with OMT was based on Physical Exam  After verbal consent patient was treated with HVLA, ME, FPR techniques in cervical, rib, thoracic,  areas  Patient tolerated the procedure well with improvement in symptoms  Patient given exercises, stretches and lifestyle  modifications  See medications in patient instructions if given  Patient will follow up in 4-8 weeks     The above documentation has been reviewed and is accurate and complete Judi Saa, DO       Note: This dictation was prepared with Dragon dictation along with smaller phrase technology. Any transcriptional errors that result from this process are unintentional.

## 2021-02-24 ENCOUNTER — Encounter: Payer: Self-pay | Admitting: Family Medicine

## 2021-02-24 ENCOUNTER — Other Ambulatory Visit: Payer: Self-pay

## 2021-02-24 ENCOUNTER — Ambulatory Visit (INDEPENDENT_AMBULATORY_CARE_PROVIDER_SITE_OTHER): Payer: BC Managed Care – PPO | Admitting: Family Medicine

## 2021-02-24 VITALS — BP 122/86 | HR 97 | Ht 64.0 in | Wt 140.0 lb

## 2021-02-24 DIAGNOSIS — M9901 Segmental and somatic dysfunction of cervical region: Secondary | ICD-10-CM | POA: Diagnosis not present

## 2021-02-24 DIAGNOSIS — M2550 Pain in unspecified joint: Secondary | ICD-10-CM | POA: Diagnosis not present

## 2021-02-24 DIAGNOSIS — M9902 Segmental and somatic dysfunction of thoracic region: Secondary | ICD-10-CM | POA: Diagnosis not present

## 2021-02-24 DIAGNOSIS — G4486 Cervicogenic headache: Secondary | ICD-10-CM | POA: Diagnosis not present

## 2021-02-24 DIAGNOSIS — M9908 Segmental and somatic dysfunction of rib cage: Secondary | ICD-10-CM

## 2021-02-24 MED ORDER — KETOROLAC TROMETHAMINE 30 MG/ML IJ SOLN: 30.0000 mg | Freq: Once | INTRAMUSCULAR | Status: DC

## 2021-02-24 MED ORDER — KETOROLAC TROMETHAMINE 30 MG/ML IJ SOLN
30.0000 mg | Freq: Once | INTRAMUSCULAR | Status: AC
  Administered 2021-02-24: 30 mg via INTRAMUSCULAR

## 2021-02-24 MED ORDER — METHYLPREDNISOLONE ACETATE 40 MG/ML IJ SUSP
40.0000 mg | Freq: Once | INTRAMUSCULAR | Status: AC
Start: 2021-02-24 — End: 2021-02-24
  Administered 2021-02-24: 40 mg via INTRAMUSCULAR

## 2021-02-24 NOTE — Assessment & Plan Note (Signed)
Patient does have a grade cervicogenic headaches and has had it for quite some time.  This is a chronic problem with exacerbation.  We will hold on any other type of anti-inflammatory and given Toradol which is helped her in the past.  Very low dose of methylprednisolone I think will also be beneficial.  Discussed with patient icing regimen exercises.  Increase activity slowly.  Patient will be having a breast reduction surgery in November that I think also may be helpful.  Follow-up with me in general in 4 to 5 weeks.  Patient knows if worsening pain or headache she needs to seek medical attention immediately.

## 2021-02-24 NOTE — Patient Instructions (Signed)
See me again in 4-5 weeks Hold on anti-inflammatories today If worsening headaches, seek medical attention

## 2021-03-01 DIAGNOSIS — Z719 Counseling, unspecified: Secondary | ICD-10-CM

## 2021-03-10 DIAGNOSIS — N943 Premenstrual tension syndrome: Secondary | ICD-10-CM | POA: Diagnosis not present

## 2021-03-10 DIAGNOSIS — R5383 Other fatigue: Secondary | ICD-10-CM | POA: Diagnosis not present

## 2021-03-10 DIAGNOSIS — Z7409 Other reduced mobility: Secondary | ICD-10-CM | POA: Diagnosis not present

## 2021-03-10 DIAGNOSIS — E039 Hypothyroidism, unspecified: Secondary | ICD-10-CM | POA: Diagnosis not present

## 2021-03-10 DIAGNOSIS — M542 Cervicalgia: Secondary | ICD-10-CM | POA: Diagnosis not present

## 2021-03-10 DIAGNOSIS — R5381 Other malaise: Secondary | ICD-10-CM | POA: Diagnosis not present

## 2021-04-05 ENCOUNTER — Telehealth: Payer: Self-pay | Admitting: Internal Medicine

## 2021-04-05 NOTE — Telephone Encounter (Signed)
Patient stated she tested positive yesterday with a home test. Symptoms are sore throat, drainage, and fatigue. Asking for advise for next steps.   Please advise.   Best conact #: [phone]

## 2021-04-05 NOTE — Telephone Encounter (Signed)
Patient called to let Dr Katrinka Blazing know that she has tested positive for COVID.  She said that right now she has mild symptoms (sore throat, drainage, and fatigue). She has been taking vitamins and Mucinex. Anything else you would recommend?

## 2021-04-06 NOTE — Telephone Encounter (Signed)
Informed patient. She would like to hold off on that medication for now. Told patient that if she started to feel worse, she could contact her PCP for virtual visit.

## 2021-04-07 ENCOUNTER — Ambulatory Visit: Payer: BC Managed Care – PPO | Admitting: Family Medicine

## 2021-04-21 ENCOUNTER — Ambulatory Visit: Payer: BC Managed Care – PPO | Admitting: Family Medicine

## 2021-05-10 NOTE — Progress Notes (Signed)
[name] [address] [place] [address] Phone: [phone] Subjective:   Bruce Donath, am serving as a scribe for Dr. Antoine Primas. This visit occurred during the SARS-CoV-2 public health emergency.  Safety protocols were in place, including screening questions prior to the visit, additional usage of staff PPE, and extensive cleaning of exam room while observing appropriate contact time as indicated for disinfecting solutions.   I'm seeing this patient by the request  of:  Pincus Sanes, MD  CC: Neck pain and headache follow-up  [id number]  Madison [name] is a 50 y.o. female coming in with complaint of back and neck pain. OMT 02/24/2021. Patient states that she had COVID in early July. Since then has been having tightness in L lumbar and cervical spine.  Having some mild discomfort in the lower back and usual.  Patient was not quite as active as she normally was.  Patient denies of any radiation down the legs.  Medications patient has been prescribed: None           Past Medical History:  Diagnosis Date   Migraine     Allergies  Allergen Reactions   Lactose Intolerance (Gi)     Gives her migraines     Review of Systems:  No headache, visual changes, nausea, vomiting, diarrhea, constipation, dizziness, abdominal pain, skin rash, fevers, chills, night sweats, weight loss, swollen lymph nodes, body aches, joint swelling, chest pain, shortness of breath, mood changes. POSITIVE muscle aches  Objective  Blood pressure 126/84, pulse 79, height 5\' 4"  (1.626 m), weight 137 lb (62.1 kg), SpO2 98 %.   General: No apparent distress alert and oriented x3 mood and affect normal, dressed appropriately.  HEENT: Pupils equal, extraocular movements intact  Respiratory: Patient's speak in full sentences and does not appear short of breath  Cardiovascular: No lower extremity edema, non tender, no erythema  Neck exam does have some limited  range of motion. Tighter on the.  Also has some tightness noted in the left sacroiliac joint.  Seems to be somewhat worse.  Osteopathic findings  C2 flexed rotated and side bent right C7 flexed rotated and side bent left T3 extended rotated and side bent right inhaled rib T9 extended rotated and side bent left L2 flexed rotated and side bent right Sacrum left on left       Assessment and Plan:  Cervicogenic headache Chronic problem but likely no significant headaches at the moment.  Patient do not did have COVID and has been a long time since we have seen patient.  Refill mild increase in tightness.  Responded well to osteopathic manipulation.  Discussed icing regimen and home exercises.  Follow-up with me again in 4 to 6 weeks secondary to the increasing tightness.   Nonallopathic problems  Decision today to treat with OMT was based on Physical Exam  After verbal consent patient was treated with HVLA, ME, FPR techniques in cervical, rib, thoracic, lumbar, and sacral  areas  Patient tolerated the procedure well with improvement in symptoms  Patient given exercises, stretches and lifestyle modifications  See medications in patient instructions if given  Patient will follow up in 4-8 weeks     The above documentation has been reviewed and is accurate and complete , DO        Note: This dictation was prepared with Dragon dictation along with smaller phrase technology. Any transcriptional errors that result from this process are unintentional.

## 2021-05-11 ENCOUNTER — Ambulatory Visit (INDEPENDENT_AMBULATORY_CARE_PROVIDER_SITE_OTHER): Payer: BC Managed Care – PPO | Admitting: Family Medicine

## 2021-05-11 ENCOUNTER — Encounter: Payer: Self-pay | Admitting: Family Medicine

## 2021-05-11 ENCOUNTER — Other Ambulatory Visit: Payer: Self-pay

## 2021-05-11 VITALS — BP 126/84 | HR 79 | Ht 64.0 in | Wt 137.0 lb

## 2021-05-11 DIAGNOSIS — M9903 Segmental and somatic dysfunction of lumbar region: Secondary | ICD-10-CM | POA: Diagnosis not present

## 2021-05-11 DIAGNOSIS — G4486 Cervicogenic headache: Secondary | ICD-10-CM | POA: Diagnosis not present

## 2021-05-11 DIAGNOSIS — M9904 Segmental and somatic dysfunction of sacral region: Secondary | ICD-10-CM | POA: Diagnosis not present

## 2021-05-11 DIAGNOSIS — M9908 Segmental and somatic dysfunction of rib cage: Secondary | ICD-10-CM | POA: Diagnosis not present

## 2021-05-11 DIAGNOSIS — M9902 Segmental and somatic dysfunction of thoracic region: Secondary | ICD-10-CM

## 2021-05-11 DIAGNOSIS — M9901 Segmental and somatic dysfunction of cervical region: Secondary | ICD-10-CM

## 2021-05-11 NOTE — Patient Instructions (Signed)
See me in 4-6 weeks Good luck with moving furniture

## 2021-05-11 NOTE — Assessment & Plan Note (Signed)
Chronic problem but likely no significant headaches at the moment.  Patient do not did have COVID and has been a long time since we have seen patient.  Refill mild increase in tightness.  Responded well to osteopathic manipulation.  Discussed icing regimen and home exercises.  Follow-up with me again in 4 to 6 weeks secondary to the increasing tightness.

## 2021-06-14 NOTE — Progress Notes (Signed)
[name] [address] [place] [address] Phone: [phone] Subjective:   INadine Counts, am serving as a scribe for Dr. Antoine Primas. This visit occurred during the SARS-CoV-2 public health emergency.  Safety protocols were in place, including screening questions prior to the visit, additional usage of staff PPE, and extensive cleaning of exam room while observing appropriate contact time as indicated for disinfecting solutions.   I'm seeing this patient by the request  of:  Pincus Sanes, MD  CC: Neck, headache upper back pain follow-up  [id number]  Madison [name] is a 50 y.o. female coming in with complaint of back and neck pain. OMT 05/11/2021. Patient states pain is unchanged. No new complaints.  Patient still has a tightness noted.  Seems to be more left greater than right.  Has also noticed some mild left lower back pain as well.  No radiation down the legs or any numbness or tingling.  Medications patient has been prescribed: None  Taking:        Past Medical History:  Diagnosis Date   Migraine     Allergies  Allergen Reactions   Lactose Intolerance (Gi)     Gives her migraines     Review of Systems:  No , visual changes, nausea, vomiting, diarrhea, constipation, dizziness, abdominal pain, skin rash, fevers, chills, night sweats, weight loss, swollen lymph nodes, joint swelling, chest pain, shortness of breath, mood changes. POSITIVE muscle aches, body aches, headache  Objective  Blood pressure 118/72, pulse 69, height 5\' 4"  (1.626 m), weight 138 lb (62.6 kg), SpO2 99 %.   General: No apparent distress alert and oriented x3 mood and affect normal, dressed appropriately.  HEENT: Pupils equal, extraocular movements intact  Respiratory: Patient's speak in full sentences and does not appear short of breath  Cardiovascular: No lower extremity edema, non tender, no erythema  Neck exam still has some tightness noted with  sidebending bilaterally.  Patient does have some tightness in the scapular region as well.  Osteopathic findings  C2 flexed rotated and side bent right C6 flexed rotated and side bent left T3 extended rotated and side bent right inhaled rib T6 extended rotated and side bent left L2 flexed rotated and side bent right Sacrum left on left       Assessment and Plan:  Chronic upper back pain Continue upper back tightness.  Chronic problem with exacerbation.  Has had some intermittent migraines but not as severe as what she has had previously as well as not having as much frequency at the moment.  Patient does have some increasing stress that I think is contributing as well.  We will need to continue to monitor.  Discussed icing regimen and home exercises, increase activity slowly.  Follow-up with me again in 6 to 8 weeks.   Nonallopathic problems  Decision today to treat with OMT was based on Physical Exam  After verbal consent patient was treated with HVLA, ME, FPR techniques in cervical, rib, thoracic, lumbar, and sacral  areas  Patient tolerated the procedure well with improvement in symptoms  Patient given exercises, stretches and lifestyle modifications  See medications in patient instructions if given  Patient will follow up in 4-8 weeks      The above documentation has been reviewed and is accurate and complete , DO       Note: This dictation was prepared with Dragon dictation along with smaller phrase technology. Any transcriptional errors that result from  this process are unintentional.

## 2021-06-15 ENCOUNTER — Ambulatory Visit (INDEPENDENT_AMBULATORY_CARE_PROVIDER_SITE_OTHER): Payer: BC Managed Care – PPO | Admitting: Family Medicine

## 2021-06-15 ENCOUNTER — Other Ambulatory Visit: Payer: Self-pay

## 2021-06-15 VITALS — BP 118/72 | HR 69 | Ht 64.0 in | Wt 138.0 lb

## 2021-06-15 DIAGNOSIS — M9903 Segmental and somatic dysfunction of lumbar region: Secondary | ICD-10-CM

## 2021-06-15 DIAGNOSIS — M9902 Segmental and somatic dysfunction of thoracic region: Secondary | ICD-10-CM

## 2021-06-15 DIAGNOSIS — M9908 Segmental and somatic dysfunction of rib cage: Secondary | ICD-10-CM | POA: Diagnosis not present

## 2021-06-15 DIAGNOSIS — M9904 Segmental and somatic dysfunction of sacral region: Secondary | ICD-10-CM | POA: Diagnosis not present

## 2021-06-15 DIAGNOSIS — M9901 Segmental and somatic dysfunction of cervical region: Secondary | ICD-10-CM | POA: Diagnosis not present

## 2021-06-15 DIAGNOSIS — G8929 Other chronic pain: Secondary | ICD-10-CM

## 2021-06-15 DIAGNOSIS — M549 Dorsalgia, unspecified: Secondary | ICD-10-CM | POA: Diagnosis not present

## 2021-06-15 NOTE — Assessment & Plan Note (Signed)
Continue upper back tightness.  Chronic problem with exacerbation.  Has had some intermittent migraines but not as severe as what she has had previously as well as not having as much frequency at the moment.  Patient does have some increasing stress that I think is contributing as well.  We will need to continue to monitor.  Discussed icing regimen and home exercises, increase activity slowly.  Follow-up with me again in 6 to 8 weeks.

## 2021-06-15 NOTE — Patient Instructions (Signed)
Good to see you! Good luck with market Continue everything else See you again in 4-6 weeks

## 2021-07-09 ENCOUNTER — Telehealth: Payer: Self-pay | Admitting: Surgical

## 2021-07-09 NOTE — Telephone Encounter (Signed)
Pt called 10/21 regarding her surgery with [facility] November 3rd. She wanted to speak to someone before her appointment next week about if it would be "possible to instead of just having her implants removed, if she could go ahead and do the lift as well?"  Please follow-up at [phone]

## 2021-07-09 NOTE — Telephone Encounter (Signed)
Patient called today, and would like to go ahead with mastopexy at the time of implant removal in November. Was concerned of additional sagging. Pre-Op is with Gerre Pebbles on 07/13/2021. The  quote she has is expired and will need an updated one. Advised patient I will have [place] send her a new quote and see if we have allotted time for the mastopexy. Will discuss more in detail at time of Pre-op Tues. Patient understood and agreed with plan.

## 2021-07-12 NOTE — Progress Notes (Signed)
Patient ID: Madison [name], female    DOB: 1970-10-15, 50 y.o.   MRN: [id number]  Chief Complaint  Patient presents with   Pre-op Exam      ICD-10-CM   1. Chronic bilateral thoracic back pain  M54.6    G89.29        History of Present Illness: Madison [name] is a 50 y.o.  female  with a history of breast implants.  She presents for preoperative evaluation for upcoming procedure, breast implant removal with mastopexy, scheduled for 07/22/2021 with Dr. Ulice Bold.  The patient has not had problems with anesthesia.  She had mild PONV that was managed with Zofran.  She denies any personal or family history of clots or clotting disorder.  She is managed by an integrative health care provider which is why she takes Synthroid for low normal thyroid levels.  She is unsure as to why blood dyscrasia was listed in her chart as a diagnosis.  She states that she takes several supplements and vitamins, but no significant medical history.  Patient denies any history of tobacco or drug use.  She does not drink alcohol.  Summary of Previous Visit: Patient reports previous implant placement in 2009 by Dr. Delford Field who has since retired.  She suspects that they are submuscular silicone implants.  She is now a DD cup, was a B cup preoperatively.  Since then, she endorses progressively worsening back and neck discomfort in the setting of her now large breasts.  Most recent mammogram 08/2020 and negative.  Implant on the right felt a bit more firm on exam.  Plan discussed with patient as for implant removal and mastopexy.  PMH Significant for: Previous implant placement, chronic back/neck discomfort in setting of large breasts, chronic headaches.   Past Medical History: Allergies: Allergies  Allergen Reactions   Lactose Intolerance (Gi)     Gives her migraines    Current Medications:  Current Outpatient Medications:    ARMOUR THYROID 30 MG tablet, Take 30 mg by mouth daily., Disp: , Rfl:     ARMOUR THYROID 60 MG tablet, Take by mouth., Disp: , Rfl:    Biotin 5000 MCG CAPS, Take by mouth., Disp: , Rfl:    Cholecalciferol (VITAMIN D3) 1000 units CAPS, Take 1 capsule by mouth daily., Disp: , Rfl:    MAGNESIUM MALATE PO, Take by mouth., Disp: , Rfl:    Menaquinone-7 (VITAMIN K2) 100 MCG CAPS, Take 1 capsule by mouth daily., Disp: , Rfl:    Multiple Vitamin (MULTIVITAMIN) tablet, Take 1 tablet by mouth daily., Disp: , Rfl:    Probiotic Product (PROBIOTIC-10 PO), Take 1 capsule by mouth daily., Disp: , Rfl:    progesterone (PROMETRIUM) 100 MG capsule, Take 100 mg by mouth at bedtime., Disp: , Rfl:    RESTASIS 0.05 % ophthalmic emulsion, , Disp: , Rfl:    riboflavin (VITAMIN B-2) 100 MG TABS tablet, Take 100 mg by mouth daily., Disp: , Rfl:    Ubrogepant (UBRELVY) 50 MG TABS, Take 50 mg once as needed for migraine, may repeat x 1 after 2 hrs, Disp: 10 tablet, Rfl: 11   Zinc 30 MG TABS, Take by mouth., Disp: , Rfl:    baclofen (LIORESAL) 10 MG tablet, Take 10 mg by mouth daily., Disp: , Rfl:    Ibuprofen-Famotidine (DUEXIS) 800-26.6 MG TABS, Take 1 tablet by mouth 3 (three) times daily as needed., Disp: 90 tablet, Rfl: 3   progesterone (PROMETRIUM) 100 MG capsule, Take 300 mg by  mouth daily., Disp: , Rfl:    timolol (TIMOPTIC) 0.5 % ophthalmic solution, SMARTSIG:In Eye(s) (Patient not taking: Reported on 07/13/2021), Disp: , Rfl:   Past Medical Problems: Past Medical History:  Diagnosis Date   Migraine     Past Surgical History: Past Surgical History:  Procedure Laterality Date   BREAST SURGERY     OTHER SURGICAL HISTORY     Various reconstruction from accident    Social History: Social History   Socioeconomic History   Marital status: Married    Spouse name: Greggory Stallion "Bud"   Number of children: 2   Years of education: Not on file   Highest education level: Bachelor's degree (e.g., BA, AB, BS)  Occupational History   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 She lives with her husband and 2 teenagers in a 2 story house. They have one dog. Patient drinks 1 cup of coffee a day. She walks 2-3 miles, goes to the gym or does yoga daily.    Social Determinants of Health   Financial Resource Strain: Not on file  Food Insecurity: Not on file  Transportation Needs: Not on file  Physical Activity: Not on file  Stress: Not on file  Social Connections: Not on file  Intimate Partner Violence: Not on file    Family History: Family History  Problem Relation Age of Onset   Depression Mother    Hypertension Father    Diabetes Father     Review of Systems: ROS No recent illness or infection  Physical Exam: Vital Signs BP 122/83 (BP Location: Left Arm, Patient Position: Sitting, Cuff Size: Normal)   Pulse 82   Ht 5\' 4"  (1.626 m)   Wt 137 lb (62.1 kg)   SpO2 100%   BMI 23.52 kg/m   Physical Exam Constitutional:      General: Not in acute distress.    Appearance: Normal appearance. Not ill-appearing.  HENT:     Head: Normocephalic and atraumatic.  Eyes:     Pupils: Pupils are equal, round Neck:     Musculoskeletal: Normal range of motion.  Cardiovascular:     Rate and Rhythm: Normal rate    Pulses: Normal pulses.  Pulmonary:     Effort: Pulmonary effort is normal. No respiratory distress.  Abdominal:     General: Abdomen is flat. There is no distension.  Musculoskeletal: Normal range of motion.  No lower extremity swelling or edema.  Ambulatory.  No obvious varicosities.  Peripheral pulses intact. Skin:    General: Skin is warm and dry.     Findings: No erythema or rash.  Neurological:     General: No focal deficit present.     Mental  Status: Alert and oriented to person, place, and time. Mental status is at baseline.     Motor: No weakness.  Psychiatric:        Mood and Affect: Mood normal.        Behavior: Behavior normal.    Assessment/Plan: The patient is scheduled for breast implant removal with mastopexy 07/22/2021 with Dr. 13/11/2020.  Risks, benefits, and alternatives of procedure discussed, questions answered  and consent obtained.    Smoking Status: Non-smoker. Last Mammogram: Reports negative mammogram 09/2020.  Cannot verify in chart.  Caprini Score: 3; Risk Factors include: Age and length of planned surgery. Recommendation for mechanical prophylaxis. Encourage early ambulation.   Pictures obtained: 11/24/2020.  Post-op Rx sent to pharmacy: Norco, Zofran, Keflex.  Patient was provided with the General Surgical Risk consent document and Pain Medication Agreement prior to their appointment.  They had adequate time to read through the risk consent documents and Pain Medication Agreement. We also discussed them in person together during this preop appointment. All of their questions were answered to their satisfaction.  Recommended calling if they have any further questions.  Risk consent form and Pain Medication Agreement to be scanned into patient's chart.  The risk that can be encountered with mastopexy/breast lift were discussed and include the following but not limited to these:  Breast asymmetry, fluid accumulation, firmness of the breast, inability to breast feed, loss of nipple or areola, skin loss, decrease or no nipple sensation, fat necrosis of the breast tissue, bleeding, infection, healing delay.  There are risks of anesthesia, changes to skin sensation and injury to nerves or blood vessels.  The muscle can be temporarily or permanently injured.  You may have an allergic reaction to tape, suture, glue, blood products which can result in skin discoloration, swelling, pain, skin lesions, poor healing.  Any of  these can lead to the need for revisonal surgery or stage procedures.  A mastopexy has potential to interfere with diagnostic procedures.  Nipple or breast piercing can increase risks of infection.  This procedure is best done when the breast is fully developed.  Changes in the breast will continue to occur over time.  Pregnancy can alter the outcomes of previous breast surgery, weight gain and weigh loss can also effect the long term appearance.    Electronically signed by: Evelena Leyden, PA-C 07/13/2021 1:55 PM

## 2021-07-13 ENCOUNTER — Ambulatory Visit (INDEPENDENT_AMBULATORY_CARE_PROVIDER_SITE_OTHER): Payer: BC Managed Care – PPO | Admitting: Physician Assistant

## 2021-07-13 ENCOUNTER — Other Ambulatory Visit: Payer: Self-pay

## 2021-07-13 ENCOUNTER — Encounter: Payer: Self-pay | Admitting: Physician Assistant

## 2021-07-13 ENCOUNTER — Ambulatory Visit: Payer: BC Managed Care – PPO | Admitting: Family Medicine

## 2021-07-13 ENCOUNTER — Ambulatory Visit (INDEPENDENT_AMBULATORY_CARE_PROVIDER_SITE_OTHER): Payer: BC Managed Care – PPO | Admitting: Sports Medicine

## 2021-07-13 VITALS — BP 122/83 | HR 82 | Ht 64.0 in | Wt 137.0 lb

## 2021-07-13 VITALS — BP 136/92 | HR 59 | Ht 64.0 in | Wt 137.0 lb

## 2021-07-13 DIAGNOSIS — M9904 Segmental and somatic dysfunction of sacral region: Secondary | ICD-10-CM

## 2021-07-13 DIAGNOSIS — M9903 Segmental and somatic dysfunction of lumbar region: Secondary | ICD-10-CM

## 2021-07-13 DIAGNOSIS — M9901 Segmental and somatic dysfunction of cervical region: Secondary | ICD-10-CM

## 2021-07-13 DIAGNOSIS — G8929 Other chronic pain: Secondary | ICD-10-CM

## 2021-07-13 DIAGNOSIS — M9902 Segmental and somatic dysfunction of thoracic region: Secondary | ICD-10-CM | POA: Diagnosis not present

## 2021-07-13 DIAGNOSIS — Z719 Counseling, unspecified: Secondary | ICD-10-CM

## 2021-07-13 DIAGNOSIS — M9905 Segmental and somatic dysfunction of pelvic region: Secondary | ICD-10-CM

## 2021-07-13 DIAGNOSIS — M546 Pain in thoracic spine: Secondary | ICD-10-CM

## 2021-07-13 DIAGNOSIS — M9908 Segmental and somatic dysfunction of rib cage: Secondary | ICD-10-CM

## 2021-07-13 DIAGNOSIS — M549 Dorsalgia, unspecified: Secondary | ICD-10-CM

## 2021-07-13 MED ORDER — ONDANSETRON 4 MG PO TBDP: 4.0000 mg | ORAL_TABLET | Freq: Three times a day (TID) | ORAL | 0 refills | Status: DC | PRN

## 2021-07-13 MED ORDER — CEPHALEXIN 500 MG PO CAPS: 500.0000 mg | ORAL_CAPSULE | Freq: Four times a day (QID) | ORAL | 0 refills | Status: AC

## 2021-07-13 MED ORDER — HYDROCODONE-ACETAMINOPHEN 5-325 MG PO TABS: 1.0000 | ORAL_TABLET | Freq: Four times a day (QID) | ORAL | 0 refills | Status: AC | PRN

## 2021-07-13 NOTE — Progress Notes (Signed)
Madison [name] D.[facility] [address] [place] [address] Phone: [phone]   Assessment and Plan:     1. Chronic upper back pain 2. Somatic dysfunction of cervical region 3. Somatic dysfunction of thoracic region 4. Somatic dysfunction of lumbar region 5. Somatic dysfunction of pelvic region 6. Somatic dysfunction of rib region 7. Somatic dysfunction of sacral region -Chronic with exacerbation, subsequent sports medicine visit - Recurrence of patient's typical musculoskeletal pains that have been well treated with OMT in the past.  Patient elected for repeat OMT today.   Decision today to treat with OMT was based on Physical Exam   After verbal consent patient was treated with HVLA (high velocity low amplitude), ME (muscle energy), FPR (flex positional release), ST (soft tissue), PC/PD (Pelvic Compression/ Pelvic Decompression) techniques in cervical, rib, thoracic, lumbar, and pelvic areas. Patient tolerated the procedure well with improvement in symptoms.  Patient educated on potential side effects of soreness and recommended to rest, hydrate, and use Tylenol as needed for pain control.   Pertinent previous records reviewed include previous sportsman note   Follow Up: In 6 to 8 weeks for repeat OMT   Subjective:   I, Madison [name], am serving as a Neurosurgeon for Dr. Richardean Sale.  Chief Complaint: back and neck pain   HPI:   07/13/21 Patient is a 50 year old female presenting with neck and back pain. Patient last saw Dr. Katrinka Blazing on 06/15/21 and had OMT. today patient states pain remains the same. Had 2 migraines within the same week. Left hip is feels off again. Tingling in that foot and hand, but usually stops after manipulation.  Relevant Historical Information: History of Cerva genic headaches,  Additional pertinent review of systems negative.  Current Outpatient Medications  Medication Sig Dispense Refill   ARMOUR THYROID 30 MG tablet Take  30 mg by mouth daily.     ARMOUR THYROID 60 MG tablet Take by mouth.     baclofen (LIORESAL) 10 MG tablet Take 10 mg by mouth daily.     Biotin 5000 MCG CAPS Take by mouth.     Cholecalciferol (VITAMIN D3) 1000 units CAPS Take 1 capsule by mouth daily.     Ibuprofen-Famotidine (DUEXIS) 800-26.6 MG TABS Take 1 tablet by mouth 3 (three) times daily as needed. 90 tablet 3   MAGNESIUM MALATE PO Take by mouth.     Menaquinone-7 (VITAMIN K2) 100 MCG CAPS Take 1 capsule by mouth daily.     Multiple Vitamin (MULTIVITAMIN) tablet Take 1 tablet by mouth daily.     Probiotic Product (PROBIOTIC-10 PO) Take 1 capsule by mouth daily.     progesterone (PROMETRIUM) 100 MG capsule Take 300 mg by mouth daily.     progesterone (PROMETRIUM) 100 MG capsule Take 100 mg by mouth at bedtime.     RESTASIS 0.05 % ophthalmic emulsion      riboflavin (VITAMIN B-2) 100 MG TABS tablet Take 100 mg by mouth daily.     timolol (TIMOPTIC) 0.5 % ophthalmic solution SMARTSIG:In Eye(s) (Patient not taking: Reported on 07/13/2021)     Ubrogepant (UBRELVY) 50 MG TABS Take 50 mg once as needed for migraine, may repeat x 1 after 2 hrs 10 tablet 11   Zinc 30 MG TABS Take by mouth.     No current facility-administered medications for this visit.      Objective:     Vitals:   07/13/21 1454  BP: (!) 136/92  Pulse: (!) 59  SpO2: 98%  Weight: 137 lb (62.1 kg)  Height: 5\' 4"  (1.626 m)      Body mass index is 23.52 kg/m.    Physical Exam:     General: Well-appearing, cooperative, sitting comfortably in no acute distress.   OMT Physical Exam:  ASIS Compression Test: Positive Right Cervical: TTP paraspinal, C3 RLSL Rib: Bilateral elevated first rib with mild TTP Thoracic: TTP paraspinal, T5-9 RRSL Lumbar: TTP paraspinal, L1-3 RRSL Pelvis: Right anterior innominate Sacrum: Positive sphinx.  TTP to right SI with compression.  Left on right.  Electronically signed by:  D[name] 4:47 PM 07/13/21

## 2021-07-13 NOTE — Patient Instructions (Signed)
See me in 4 weeks 

## 2021-07-20 DIAGNOSIS — Z719 Counseling, unspecified: Secondary | ICD-10-CM

## 2021-07-30 ENCOUNTER — Encounter: Payer: Self-pay | Admitting: Plastic Surgery

## 2021-07-30 ENCOUNTER — Other Ambulatory Visit: Payer: Self-pay

## 2021-07-30 ENCOUNTER — Ambulatory Visit (INDEPENDENT_AMBULATORY_CARE_PROVIDER_SITE_OTHER): Payer: BC Managed Care – PPO | Admitting: Plastic Surgery

## 2021-07-30 DIAGNOSIS — Z719 Counseling, unspecified: Secondary | ICD-10-CM | POA: Insufficient documentation

## 2021-07-30 NOTE — Progress Notes (Signed)
The patient is a 50 year old female here for follow-up after undergoing removal of her implants with a mastopexy.  She is doing really well.  She has a little bit of swelling and bruising as expected.  There is no sign of a hematoma or infection.  She is very pleased with the results to date.  She can come out of the binder and go into a sports bra.  She can remove the dressings in the next week.  I would like to see her back in 2 weeks.  Pictures were obtained of the patient and placed in the chart with the patient's or guardian's permission.

## 2021-08-01 MED ORDER — CLOBETASOL PROPIONATE 0.05 % EX CREA
1.0000 | TOPICAL_CREAM | Freq: Two times a day (BID) | CUTANEOUS | 0 refills | Status: DC
Start: 2021-08-01 — End: 2021-09-03

## 2021-08-01 NOTE — Addendum Note (Signed)
Addended by: Peggye Form on: 08/01/2021 09:43 PM   Modules accepted: Orders

## 2021-08-02 ENCOUNTER — Telehealth: Payer: Self-pay

## 2021-08-02 NOTE — Telephone Encounter (Signed)
Patient called to say she saw Dr. Ulice Bold last week and she had a rash that Dr. Ulice Bold believes is from the blood pressure cuff.  Patient said she was going to prescribe Triamcinolone Acetonide Ointment USP, 0.1% for her, but her pharmacy hasn't received the prescription.  Please call.  *Patient's preferred pharmacy is [facility] in [place]

## 2021-08-03 NOTE — Telephone Encounter (Signed)
Pt is aware.  

## 2021-08-06 ENCOUNTER — Telehealth: Payer: Self-pay | Admitting: Plastic Surgery

## 2021-08-06 NOTE — Telephone Encounter (Signed)
PLEASE FOLLOW UP.  Patient called this morning and ask if someone could give her a call. Her bandages are removed and she said that one incision looks good but the other looks as though it is separating.   Thank You

## 2021-08-06 NOTE — Telephone Encounter (Signed)
Returned patients call. LMVM 

## 2021-08-16 ENCOUNTER — Encounter: Payer: Self-pay | Admitting: Internal Medicine

## 2021-08-17 ENCOUNTER — Other Ambulatory Visit: Payer: Self-pay

## 2021-08-17 MED ORDER — UBRELVY 50 MG PO TABS: ORAL_TABLET | ORAL | 11 refills | Status: DC

## 2021-08-18 NOTE — Telephone Encounter (Signed)
Patient calling in  Patient says she has contacted pharmacy several times since yesterday & they have not received the rx for Ubrogepant (UBRELVY) 50 MG TABS  Please resubmit to pharmacy  [id number] - [place], [place] - [address] AT [facility] [address]  Phone:  [phone] Fax:  [phone]

## 2021-08-19 ENCOUNTER — Other Ambulatory Visit: Payer: Self-pay

## 2021-08-19 DIAGNOSIS — G43511 Persistent migraine aura without cerebral infarction, intractable, with status migrainosus: Secondary | ICD-10-CM

## 2021-08-19 MED ORDER — UBRELVY 50 MG PO TABS: ORAL_TABLET | ORAL | 11 refills | Status: DC

## 2021-08-31 ENCOUNTER — Ambulatory Visit: Payer: BC Managed Care – PPO | Admitting: Plastic Surgery

## 2021-09-03 ENCOUNTER — Other Ambulatory Visit: Payer: Self-pay

## 2021-09-03 ENCOUNTER — Encounter: Payer: Self-pay | Admitting: Plastic Surgery

## 2021-09-03 ENCOUNTER — Ambulatory Visit (INDEPENDENT_AMBULATORY_CARE_PROVIDER_SITE_OTHER): Payer: BC Managed Care – PPO | Admitting: Plastic Surgery

## 2021-09-03 DIAGNOSIS — Z719 Counseling, unspecified: Secondary | ICD-10-CM

## 2021-09-03 NOTE — Progress Notes (Signed)
The patient is a 50 year old female here for follow-up after undergoing removal of her implants with a mastopexy.  Overall she is doing really well.  She noticed a few stitches still in place.  I removed those today.  Continue with the skin Uva and follow-up as needed.

## 2021-09-03 NOTE — Addendum Note (Signed)
Addended by: Drema Dallas K on: 09/03/2021 02:03 PM   Modules accepted: Orders

## 2021-09-07 NOTE — Progress Notes (Addendum)
Madison [name] D.[facility] [address] [place] [address] Phone: [phone]   Assessment and Plan:     1. Chronic upper back pain 2. Somatic dysfunction of cervical region 3. Somatic dysfunction of thoracic region 4. Somatic dysfunction of lumbar region 5. Somatic dysfunction of pelvic region 6. Somatic dysfunction of sacral region -Chronic with exacerbation, subsequent visit - Recurrence of chronic musculoskeletal complaints with most prominent being left-sided neck and left lower back - Patient has had decrease in neck and thoracic pain after breast implant removal in 07/2021 - Patient has received significant relief with OMT in the past.  Elects for repeat OMT today.  Tolerated well per note below. - Decision today to treat with OMT was based on Physical Exam   After verbal consent patient was treated with HVLA (high velocity low amplitude), ME (muscle energy), FPR (flex positional release), ST (soft tissue), PC/PD (Pelvic Compression/ Pelvic Decompression) techniques in cervical, sacrum, thoracic, lumbar, and pelvic areas. Patient tolerated the procedure well with improvement in symptoms.  Patient educated on potential side effects of soreness and recommended to rest, hydrate, and use Tylenol as needed for pain control.   Pertinent previous records reviewed include none   Follow Up: 4 weeks for repeat OMT   Subjective:    I, Madison [name], am serving as a Neurosurgeon for Doctor Richardean Sale  Chief Complaint: back and neck pain   HPI:  07/13/21 Patient is a 50 year old female presenting with neck and back pain. Patient last saw Dr. Katrinka Blazing on 06/15/21 and had OMT. today patient states pain remains the same. Had 2 migraines within the same week. Left hip is feels off again. Tingling in that foot and hand, but usually stops after manipulation.  09/08/2021 Patient states can tell she needs an adjust in hip and L side of neck but not terrible     Relevant Historical Information: History of Cerva genic headaches,   Additional pertinent review of systems negative.  Current Outpatient Medications  Medication Sig Dispense Refill   ARMOUR THYROID 30 MG tablet Take 30 mg by mouth daily.     ARMOUR THYROID 60 MG tablet Take by mouth.     baclofen (LIORESAL) 10 MG tablet Take 10 mg by mouth daily. As needed     Biotin 5000 MCG CAPS Take by mouth.     Cholecalciferol (VITAMIN D3) 1000 units CAPS Take 1 capsule by mouth daily.     MAGNESIUM MALATE PO Take by mouth.     Menaquinone-7 (VITAMIN K2) 100 MCG CAPS Take 1 capsule by mouth daily.     Multiple Vitamin (MULTIVITAMIN) tablet Take 1 tablet by mouth daily.     Probiotic Product (PROBIOTIC-10 PO) Take 1 capsule by mouth daily.     progesterone (PROMETRIUM) 100 MG capsule Take 300 mg by mouth daily.     progesterone (PROMETRIUM) 100 MG capsule Take 100 mg by mouth at bedtime.     RESTASIS 0.05 % ophthalmic emulsion      riboflavin (VITAMIN B-2) 100 MG TABS tablet Take 100 mg by mouth daily.     timolol (TIMOPTIC) 0.5 % ophthalmic solution      Zinc 30 MG TABS Take by mouth.     No current facility-administered medications for this visit.      Objective:     Vitals:   09/08/21 1007  BP: 110/70  Pulse: 83  SpO2: 98%  Weight: 136 lb (61.7 kg)  Height: 5\' 4"  (  1.626 m)      Body mass index is 23.34 kg/m.    Physical Exam:     General: Well-appearing, cooperative, sitting comfortably in no acute distress.   OMT Physical Exam:  ASIS Compression Test: Positive left Cervical: TTP paraspinal, T3 RLSR Sacrum: Positive sphinx, TTP left sacral base Thoracic: TTP paraspinal, T6-10 RRSL Lumbar: TTP paraspinal, L2-4 RLSR Pelvis: Left anterior innominate  Electronically signed by:  Madison [name] D.[facility] 10:34 AM 09/08/21

## 2021-09-08 ENCOUNTER — Other Ambulatory Visit: Payer: Self-pay

## 2021-09-08 ENCOUNTER — Ambulatory Visit (INDEPENDENT_AMBULATORY_CARE_PROVIDER_SITE_OTHER): Payer: BC Managed Care – PPO | Admitting: Sports Medicine

## 2021-09-08 VITALS — BP 110/70 | HR 83 | Ht 64.0 in | Wt 136.0 lb

## 2021-09-08 DIAGNOSIS — G8929 Other chronic pain: Secondary | ICD-10-CM | POA: Diagnosis not present

## 2021-09-08 DIAGNOSIS — M9905 Segmental and somatic dysfunction of pelvic region: Secondary | ICD-10-CM

## 2021-09-08 DIAGNOSIS — M9904 Segmental and somatic dysfunction of sacral region: Secondary | ICD-10-CM

## 2021-09-08 DIAGNOSIS — M9901 Segmental and somatic dysfunction of cervical region: Secondary | ICD-10-CM

## 2021-09-08 DIAGNOSIS — M549 Dorsalgia, unspecified: Secondary | ICD-10-CM | POA: Diagnosis not present

## 2021-09-08 DIAGNOSIS — M9903 Segmental and somatic dysfunction of lumbar region: Secondary | ICD-10-CM | POA: Diagnosis not present

## 2021-09-08 DIAGNOSIS — M9902 Segmental and somatic dysfunction of thoracic region: Secondary | ICD-10-CM | POA: Diagnosis not present

## 2021-09-08 NOTE — Patient Instructions (Signed)
Good to see you  °4 week follow up for repeat OMT °

## 2021-09-23 ENCOUNTER — Telehealth: Payer: Self-pay

## 2021-09-23 ENCOUNTER — Other Ambulatory Visit: Payer: Self-pay

## 2021-09-23 NOTE — Telephone Encounter (Signed)
Message left for patient today that PA had been started and turn around time is 72 hours.  Samples left up front for her to pick up at her convenience and message left with info.  Alekzander Cardell~

## 2021-09-23 NOTE — Telephone Encounter (Signed)
Tiari PAGE (Key: [id number])  Your information has been submitted to [facility]. [employer] [place] will review the request and notify you of the determination decision directly, typically within 72 hours of receiving all information.  You will also receive your request decision electronically. To check for an update later, open this request again from your dashboard.  If [employer] [place] has not responded within the specified timeframe or if you have any questions about your PA submission, contact [employer]  directly at [phone].

## 2021-09-23 NOTE — Telephone Encounter (Addendum)
Pt called stating that [facility] in [place] advise her that she needs Prior Auth. Pt states that she is going OOT tomorrow and needs a refill.   A refill request was ordered on 08/19/21 but was D/C on 09/03/21.  Please advise.  Pt states that she had gotten samples before. Wondering if that would be an option while waiting on the PA and refill so she doesn't run out she currently has one pill left.

## 2021-09-28 ENCOUNTER — Encounter: Payer: Self-pay | Admitting: Internal Medicine

## 2021-09-28 DIAGNOSIS — G43511 Persistent migraine aura without cerebral infarction, intractable, with status migrainosus: Secondary | ICD-10-CM

## 2021-09-28 NOTE — Telephone Encounter (Signed)
Patient states her insurance company and pharmacy informed her that they have not received the PA for the medication  Patient has picked up the samples of medication  Patient is requesting a call back   *see below*

## 2021-09-29 MED ORDER — UBRELVY 50 MG PO TABS: ORAL_TABLET | ORAL | 11 refills | Status: DC

## 2021-09-30 DIAGNOSIS — H52223 Regular astigmatism, bilateral: Secondary | ICD-10-CM | POA: Diagnosis not present

## 2021-09-30 NOTE — Telephone Encounter (Signed)
Insurance needed additional info and requested clinicals be sent prior to approving.  Clinicals faxed today.

## 2021-09-30 NOTE — Telephone Encounter (Signed)
Patient informed and I left new samples upfront for pick up.

## 2021-10-01 NOTE — Telephone Encounter (Signed)
PA was submitted for Ubrelvy 50MG  tablets and Approvedon January 12. Effective from 09/23/2021 through 12/15/2021.

## 2021-10-04 DIAGNOSIS — R5383 Other fatigue: Secondary | ICD-10-CM | POA: Diagnosis not present

## 2021-10-04 DIAGNOSIS — R5381 Other malaise: Secondary | ICD-10-CM | POA: Diagnosis not present

## 2021-10-04 DIAGNOSIS — E039 Hypothyroidism, unspecified: Secondary | ICD-10-CM | POA: Diagnosis not present

## 2021-10-04 DIAGNOSIS — E7210 Disorders of sulfur-bearing amino-acid metabolism, unspecified: Secondary | ICD-10-CM | POA: Diagnosis not present

## 2021-10-05 DIAGNOSIS — R5381 Other malaise: Secondary | ICD-10-CM | POA: Diagnosis not present

## 2021-10-05 DIAGNOSIS — R5383 Other fatigue: Secondary | ICD-10-CM | POA: Diagnosis not present

## 2021-10-05 DIAGNOSIS — F411 Generalized anxiety disorder: Secondary | ICD-10-CM | POA: Diagnosis not present

## 2021-10-05 DIAGNOSIS — E039 Hypothyroidism, unspecified: Secondary | ICD-10-CM | POA: Diagnosis not present

## 2021-10-05 DIAGNOSIS — E7210 Disorders of sulfur-bearing amino-acid metabolism, unspecified: Secondary | ICD-10-CM | POA: Diagnosis not present

## 2021-10-05 NOTE — Progress Notes (Signed)
[name] [address] [place] [address] Phone: [phone] Subjective:   Bruce Donath, am serving as a scribe for Dr. Antoine Primas.This visit occurred during the SARS-CoV-2 public health emergency.  Safety protocols were in place, including screening questions prior to the visit, additional usage of staff PPE, and extensive cleaning of exam room while observing appropriate contact time as indicated for disinfecting solutions.  I'm seeing this patient by the request  of:  Pincus Sanes, MD  CC: back or neck pain   [id number]  Zoeyjane Peele-Page is a 51 y.o. female coming in with complaint of back and neck pain. OMT on 09/08/2021 with Dr. Jean Rosenthal. Patient states that she had a catch in neck and L shoulder and Dr. Jean Rosenthal was unable to do successful adjustment. Patient continues to have migraines.   Medications patient has been prescribed: None          Reviewed prior external information including notes and imaging from previsou exam, outside providers and external EMR if available.   As well as notes that were available from care everywhere and other healthcare systems.  Past medical history, social, surgical and family history all reviewed in electronic medical record.  No pertanent information unless stated regarding to the chief complaint.   Past Medical History:  Diagnosis Date   Migraine     Allergies  Allergen Reactions   Lactose Intolerance (Gi)     Gives her migraines     Review of Systems:  No headache, visual changes, nausea, vomiting, diarrhea, constipation, dizziness, abdominal pain, skin rash, fevers, chills, night sweats, weight loss, swollen lymph nodes, body aches, joint swelling, chest pain, shortness of breath, mood changes. POSITIVE muscle aches  Objective  Blood pressure 120/88, pulse 73, height 5\' 4"  (1.626 m), weight 137 lb (62.1 kg), SpO2 98 %.   General: No apparent distress alert and oriented x3  mood and affect normal, dressed appropriately.  HEENT: Pupils equal, extraocular movements intact  Respiratory: Patient's speak in full sentences and does not appear short of breath  Cardiovascular: No lower extremity edema, non tender, no erythema  Low back exam does have some loss of lordosis.  Some tenderness to palpation still noted in the upper parascapular region right greater than left.  Tightness in the neck noted.  Tightness with sidebending.  Seems still to be more on the right occipital area where it seems to start.  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  Discussed with patient to continue to work on posture and ergonomics.  Responding well to manipulation.  Follow-up again in 6 to 8 weeks.    Nonallopathic problems  Decision today to treat with OMT was based on Physical Exam  After verbal consent patient was treated with HVLA, ME, FPR techniques in cervical, rib, thoracic, areas  Patient tolerated the procedure well with improvement in symptoms  Patient given exercises, stretches and lifestyle modifications  See medications in patient instructions if given  Patient will follow up in 4-8 weeks     The above documentation has been reviewed and is accurate and complete Judi Saa,  DO        Note: This dictation was prepared with Dragon dictation along with smaller phrase technology. Any transcriptional errors that result from this process are unintentional.

## 2021-10-06 ENCOUNTER — Other Ambulatory Visit: Payer: Self-pay

## 2021-10-06 ENCOUNTER — Encounter: Payer: Self-pay | Admitting: Family Medicine

## 2021-10-06 ENCOUNTER — Ambulatory Visit (INDEPENDENT_AMBULATORY_CARE_PROVIDER_SITE_OTHER): Payer: BC Managed Care – PPO | Admitting: Family Medicine

## 2021-10-06 VITALS — BP 120/88 | HR 73 | Ht 64.0 in | Wt 137.0 lb

## 2021-10-06 DIAGNOSIS — M9908 Segmental and somatic dysfunction of rib cage: Secondary | ICD-10-CM

## 2021-10-06 DIAGNOSIS — M9901 Segmental and somatic dysfunction of cervical region: Secondary | ICD-10-CM | POA: Diagnosis not present

## 2021-10-06 DIAGNOSIS — G4486 Cervicogenic headache: Secondary | ICD-10-CM | POA: Diagnosis not present

## 2021-10-06 DIAGNOSIS — M9902 Segmental and somatic dysfunction of thoracic region: Secondary | ICD-10-CM

## 2021-10-06 NOTE — Telephone Encounter (Signed)
Outcome Approvedon January 12 Effective from 09/23/2021 through 12/15/2021.  My-chart message sent to patient with approval.

## 2021-10-06 NOTE — Patient Instructions (Signed)
Great to see you Work on [employer] will do great See me in 6 weeks

## 2021-10-07 NOTE — Assessment & Plan Note (Signed)
Chronic has improved a little since patient did have a breast reduction surgery.  Discussed with patient to continue to work on posture and ergonomics.  Responding well to manipulation.  Follow-up again in 6 to 8 weeks.

## 2021-11-11 DIAGNOSIS — D235 Other benign neoplasm of skin of trunk: Secondary | ICD-10-CM | POA: Diagnosis not present

## 2021-11-11 DIAGNOSIS — D2372 Other benign neoplasm of skin of left lower limb, including hip: Secondary | ICD-10-CM | POA: Diagnosis not present

## 2021-11-11 DIAGNOSIS — D485 Neoplasm of uncertain behavior of skin: Secondary | ICD-10-CM | POA: Diagnosis not present

## 2021-11-11 DIAGNOSIS — D2371 Other benign neoplasm of skin of right lower limb, including hip: Secondary | ICD-10-CM | POA: Diagnosis not present

## 2021-11-11 DIAGNOSIS — L918 Other hypertrophic disorders of the skin: Secondary | ICD-10-CM | POA: Diagnosis not present

## 2021-11-11 DIAGNOSIS — L821 Other seborrheic keratosis: Secondary | ICD-10-CM | POA: Diagnosis not present

## 2021-11-11 DIAGNOSIS — D225 Melanocytic nevi of trunk: Secondary | ICD-10-CM | POA: Diagnosis not present

## 2021-11-17 ENCOUNTER — Ambulatory Visit: Payer: BC Managed Care – PPO | Admitting: Family Medicine

## 2021-11-25 DIAGNOSIS — J00 Acute nasopharyngitis [common cold]: Secondary | ICD-10-CM | POA: Diagnosis not present

## 2021-11-25 DIAGNOSIS — J209 Acute bronchitis, unspecified: Secondary | ICD-10-CM | POA: Diagnosis not present

## 2021-12-16 ENCOUNTER — Ambulatory Visit: Payer: BC Managed Care – PPO | Admitting: Family Medicine

## 2021-12-22 DIAGNOSIS — X32XXXS Exposure to sunlight, sequela: Secondary | ICD-10-CM | POA: Diagnosis not present

## 2021-12-22 DIAGNOSIS — L573 Poikiloderma of Civatte: Secondary | ICD-10-CM | POA: Diagnosis not present

## 2021-12-22 DIAGNOSIS — L814 Other melanin hyperpigmentation: Secondary | ICD-10-CM | POA: Diagnosis not present

## 2021-12-22 DIAGNOSIS — L578 Other skin changes due to chronic exposure to nonionizing radiation: Secondary | ICD-10-CM | POA: Diagnosis not present

## 2022-01-11 ENCOUNTER — Encounter: Payer: Self-pay | Admitting: Internal Medicine

## 2022-01-11 NOTE — Patient Instructions (Addendum)
? ? ? ? ?Medications changes include :   none.  Samples of 100 mg of Ubrelvy given.  ? ? ?Your prescription(s) have been sent to your pharmacy.  ? ? ?A referral was ordered for gastroenterology and Gyn.     Someone from that office will call you to schedule an appointment.  ? ? ?Return in about 1 year (around 01/13/2023) for Physical Exam. ? ? ? ? ?Health Maintenance, Female ?Adopting a healthy lifestyle and getting preventive care are important in promoting health and wellness. Ask your health care provider about: ?The right schedule for you to have regular tests and exams. ?Things you can do on your own to prevent diseases and keep yourself healthy. ?What should I know about diet, weight, and exercise? ?Eat a healthy diet ? ?Eat a diet that includes plenty of vegetables, fruits, low-fat dairy products, and lean protein. ?Do not eat a lot of foods that are high in solid fats, added sugars, or sodium. ?Maintain a healthy weight ?Body mass index (BMI) is used to identify weight problems. It estimates body fat based on height and weight. Your health care provider can help determine your BMI and help you achieve or maintain a healthy weight. ?Get regular exercise ?Get regular exercise. This is one of the most important things you can do for your health. Most adults should: ?Exercise for at least 150 minutes each week. The exercise should increase your heart rate and make you sweat (moderate-intensity exercise). ?Do strengthening exercises at least twice a week. This is in addition to the moderate-intensity exercise. ?Spend less time sitting. Even light physical activity can be beneficial. ?Watch cholesterol and blood lipids ?Have your blood tested for lipids and cholesterol at 51 years of age, then have this test every 5 years. ?Have your cholesterol levels checked more often if: ?Your lipid or cholesterol levels are high. ?You are older than 51 years of age. ?You are at high risk for heart disease. ?What should I know  about cancer screening? ?Depending on your health history and family history, you may need to have cancer screening at various ages. This may include screening for: ?Breast cancer. ?Cervical cancer. ?Colorectal cancer. ?Skin cancer. ?Lung cancer. ?What should I know about heart disease, diabetes, and high blood pressure? ?Blood pressure and heart disease ?High blood pressure causes heart disease and increases the risk of stroke. This is more likely to develop in people who have high blood pressure readings or are overweight. ?Have your blood pressure checked: ?Every 3-5 years if you are 70-91 years of age. ?Every year if you are 25 years old or older. ?Diabetes ?Have regular diabetes screenings. This checks your fasting blood sugar level. Have the screening done: ?Once every three years after age 13 if you are at a normal weight and have a low risk for diabetes. ?More often and at a younger age if you are overweight or have a high risk for diabetes. ?What should I know about preventing infection? ?Hepatitis B ?If you have a higher risk for hepatitis B, you should be screened for this virus. Talk with your health care provider to find out if you are at risk for hepatitis B infection. ?Hepatitis C ?Testing is recommended for: ?Everyone born from [birth year] through 1965. ?Anyone with known risk factors for hepatitis C. ?Sexually transmitted infections (STIs) ?Get screened for STIs, including gonorrhea and chlamydia, if: ?You are sexually active and are younger than 51 years of age. ?You are older than 51 years of age and  your health care provider tells you that you are at risk for this type of infection. ?Your sexual activity has changed since you were last screened, and you are at increased risk for chlamydia or gonorrhea. Ask your health care provider if you are at risk. ?Ask your health care provider about whether you are at high risk for HIV. Your health care provider may recommend a prescription medicine to help prevent  HIV infection. If you choose to take medicine to prevent HIV, you should first get tested for HIV. You should then be tested every 3 months for as long as you are taking the medicine. ?Pregnancy ?If you are about to stop having your period (premenopausal) and you may become pregnant, seek counseling before you get pregnant. ?Take 400 to 800 micrograms (mcg) of folic acid every day if you become pregnant. ?Ask for birth control (contraception) if you want to prevent pregnancy. ?Osteoporosis and menopause ?Osteoporosis is a disease in which the bones lose minerals and strength with aging. This can result in bone fractures. If you are 78 years old or older, or if you are at risk for osteoporosis and fractures, ask your health care provider if you should: ?Be screened for bone loss. ?Take a calcium or vitamin D supplement to lower your risk of fractures. ?Be given hormone replacement therapy (HRT) to treat symptoms of menopause. ?Follow these instructions at home: ?Alcohol use ?Do not drink alcohol if: ?Your health care provider tells you not to drink. ?You are pregnant, may be pregnant, or are planning to become pregnant. ?If you drink alcohol: ?Limit how much you have to: ?0-1 drink a day. ?Know how much alcohol is in your drink. In the [place] one drink equals one 12 oz bottle of beer (355 mL), one 5 oz glass of wine (148 mL), or one 1? oz glass of hard liquor (44 mL). ?Lifestyle ?Do not use any products that contain nicotine or tobacco. These products include cigarettes, chewing tobacco, and vaping devices, such as e-cigarettes. If you need help quitting, ask your health care provider. ?Do not use street drugs. ?Do not share needles. ?Ask your health care provider for help if you need support or information about quitting drugs. ?General instructions ?Schedule regular health, dental, and eye exams. ?Stay current with your vaccines. ?Tell your health care provider if: ?You often feel depressed. ?You have ever been  abused or do not feel safe at home. ?Summary ?Adopting a healthy lifestyle and getting preventive care are important in promoting health and wellness. ?Follow your health care provider's instructions about healthy diet, exercising, and getting tested or screened for diseases. ?Follow your health care provider's instructions on monitoring your cholesterol and blood pressure. ?This information is not intended to replace advice given to you by your health care provider. Make sure you discuss any questions you have with your health care provider. ?Document Revised: 01/25/2021 Document Reviewed: 01/25/2021 ?Elsevier Patient Education ? 2023 [employer] ? ?

## 2022-01-11 NOTE — Progress Notes (Signed)
? ? ?Subjective:  ? ? Patient ID: Madison [name], female    DOB: 14-Dec-1970, 51 y.o.   MRN: [id number] ? ? ?This visit occurred during the SARS-CoV-2 public health emergency.  Safety protocols were in place, including screening questions prior to the visit, additional usage of staff PPE, and extensive cleaning of exam room while observing appropriate contact time as indicated for disinfecting solutions. ? ? ? ?HPI ?Madison [name] is here for  ?Chief Complaint  ?Patient presents with  ? Annual Exam  ? ? ?She is peri-menopausal.  She feels drained, has cramps, has some brain fog.  She has had some increase in palpitations and some anxiety.  She does have a lot going on with family, work so the anxiety is not unexpected.  She is following with integrative health.  She wonders if she should see a gynecologist. ? ? ?Medications and allergies reviewed with patient and updated if appropriate. ? ?Current Outpatient Medications on File Prior to Visit  ?Medication Sig Dispense Refill  ? ARMOUR THYROID 30 MG tablet Take 30 mg by mouth daily.    ? ARMOUR THYROID 60 MG tablet Take by mouth.    ? baclofen (LIORESAL) 10 MG tablet Take 10 mg by mouth daily. As needed    ? Biotin 5000 MCG CAPS Take by mouth.    ? Cholecalciferol (VITAMIN D3) 1000 units CAPS Take 1 capsule by mouth daily.    ? MAGNESIUM MALATE PO Take by mouth.    ? Menaquinone-7 (VITAMIN K2) 100 MCG CAPS Take 1 capsule by mouth daily.    ? Multiple Vitamin (MULTIVITAMIN) tablet Take 1 tablet by mouth daily.    ? Probiotic Product (PROBIOTIC-10 PO) Take 1 capsule by mouth daily.    ? progesterone (PROMETRIUM) 100 MG capsule Take 100 mg by mouth at bedtime.    ? progesterone (PROMETRIUM) 200 MG capsule Take 200 mg by mouth daily.    ? RESTASIS 0.05 % ophthalmic emulsion     ? riboflavin (VITAMIN B-2) 100 MG TABS tablet Take 100 mg by mouth daily.    ? timolol (TIMOPTIC) 0.5 % ophthalmic solution     ? Zinc 30 MG TABS Take by mouth.    ? ?No current facility-administered  medications on file prior to visit.  ? ? ?Review of Systems  ?Constitutional:  Negative for fever.  ?Eyes:  Negative for visual disturbance.  ?Respiratory:  Negative for cough, shortness of breath and wheezing.   ?Cardiovascular:  Positive for palpitations. Negative for chest pain and leg swelling.  ?Gastrointestinal:  Positive for nausea. Negative for abdominal pain, blood in stool, constipation and diarrhea.  ?     No gerd  ?Genitourinary:  Negative for dysuria.  ?Musculoskeletal:  Positive for arthralgias (mild) and back pain (mild).  ?Skin:  Negative for rash.  ?Neurological:  Positive for headaches (occ migraines). Negative for dizziness and light-headedness.  ?Psychiatric/Behavioral:  Negative for dysphoric mood. The patient is nervous/anxious.   ? ?   ?Objective:  ? ?Vitals:  ? 01/12/22 1045  ?BP: 120/78  ?Pulse: (!) 58  ?Temp: 98.2 ?F (36.8 ?C)  ?SpO2: 99%  ? ?Filed Weights  ? 01/12/22 1045  ?Weight: 142 lb (64.4 kg)  ? ?Body mass index is 24.37 kg/m?. ? ?BP Readings from Last 3 Encounters:  ?01/12/22 120/78  ?10/06/21 120/88  ?09/08/21 110/70  ? ? ?Wt Readings from Last 3 Encounters:  ?01/12/22 142 lb (64.4 kg)  ?10/06/21 137 lb (62.1 kg)  ?09/08/21 136 lb (61.7 kg)  ? ? ? ?  07/06/2020  ?  3:25 PM  ?Depression screen PHQ 2/9  ?Decreased Interest 0  ?Down, Depressed, Hopeless 0  ?PHQ - 2 Score 0  ? ? ? ?   ? View : No data to display.  ?  ?  ?  ? ? ? ? ?  ?Physical Exam ?Constitutional: She appears well-developed and well-nourished. No distress.  ?HENT:  ?Head: Normocephalic and atraumatic.  ?Right Ear: External ear normal. Normal ear canal and TM ?Left Ear: External ear normal.  Normal ear canal and TM ?Mouth/Throat: Oropharynx is clear and moist.  ?Eyes: Conjunctivae and EOM are normal.  ?Neck: Neck supple. No tracheal deviation present. No thyromegaly present.  ?No carotid bruit  ?Cardiovascular: Normal rate, regular rhythm and normal heart sounds.   ?No murmur heard.  No edema. ?Pulmonary/Chest: Effort  normal and breath sounds normal. No respiratory distress. She has no wheezes. She has no rales.  ?Breast: deferred   ?Abdominal: Soft. She exhibits no distension. There is no tenderness.  ?Lymphadenopathy: She has no cervical adenopathy.  ?Skin: Skin is warm and dry. She is not diaphoretic.  ?Psychiatric: She has a normal mood and affect. Her behavior is normal.  ? ? ? ?No results found for: WBC, HGB, HCT, PLT, GLUCOSE, CHOL, TRIG, HDL, LDLDIRECT, LDLCALC, ALT, AST, NA, K, CL, CREATININE, BUN, CO2, TSH, PSA, INR, GLUF, HGBA1C, MICROALBUR ? ? ? ?   ?Assessment & Plan:  ? ?Physical exam: ?Screening blood work deferred-does blood work through her integrative health doctor ?Exercise walking regularly - almost daily, lifts weights, some yoga ?Weight  normal ?Substance abuse  none ? ? ?Reviewed recommended immunizations. ? ? ?Health Maintenance  ?Topic Date Due  ? HIV Screening  Never done  ? Hepatitis C Screening  Never done  ? TETANUS/TDAP  Never done  ? PAP SMEAR-Modifier  Never done  ? COLONOSCOPY (Pts 45-37yrs Insurance coverage will need to be confirmed)  Never done  ? MAMMOGRAM  Never done  ? Zoster Vaccines- Shingrix (1 of 2) Never done  ? COVID-19 Vaccine (3 - Booster for Pfizer series) 01/28/2022 (Originally 07/25/2020)  ? INFLUENZA VACCINE  04/19/2022  ? HPV VACCINES  Aged Out  ?  ? ? ?Referred for colonoscopy.  Referred for gynecology.  She thinks she may have had a tetanus.  Discussed shingles vaccine. ? ? ? ?See Problem List for Assessment and Plan of chronic medical problems. ? ? ? ? ?

## 2022-01-12 ENCOUNTER — Ambulatory Visit (INDEPENDENT_AMBULATORY_CARE_PROVIDER_SITE_OTHER): Payer: BC Managed Care – PPO | Admitting: Internal Medicine

## 2022-01-12 VITALS — BP 120/78 | HR 58 | Temp 98.2°F | Ht 64.0 in | Wt 142.0 lb

## 2022-01-12 DIAGNOSIS — Z0000 Encounter for general adult medical examination without abnormal findings: Secondary | ICD-10-CM | POA: Diagnosis not present

## 2022-01-12 DIAGNOSIS — G43511 Persistent migraine aura without cerebral infarction, intractable, with status migrainosus: Secondary | ICD-10-CM | POA: Diagnosis not present

## 2022-01-12 DIAGNOSIS — Z1211 Encounter for screening for malignant neoplasm of colon: Secondary | ICD-10-CM | POA: Diagnosis not present

## 2022-01-12 DIAGNOSIS — Z124 Encounter for screening for malignant neoplasm of cervix: Secondary | ICD-10-CM | POA: Diagnosis not present

## 2022-01-12 DIAGNOSIS — N951 Menopausal and female climacteric states: Secondary | ICD-10-CM

## 2022-01-12 MED ORDER — UBRELVY 50 MG PO TABS: ORAL_TABLET | ORAL | 11 refills | Status: DC

## 2022-01-12 NOTE — Assessment & Plan Note (Addendum)
Chronic ?Occasional migraines ?Continue Ubrelvy 50 mg as needed-typically works well, on rare occasion needs to stay for second pill ?We do have 100 mg Ubrelvy samples and she can try the higher dose to see if that works better ?

## 2022-01-12 NOTE — Assessment & Plan Note (Signed)
She is not married perimenopause ?Seeing integrative doctor who is treating her for that at this time ?We will also refer to GYN for their opinion ?Continue regular exercise, healthy diet ?Discussed treatment options ?

## 2022-01-21 IMAGING — DX DG CERVICAL SPINE 2 OR 3 VIEWS
3 series · 3 of 3 positions shown · non-contrast
Comparison: 02/28/2018

CLINICAL DATA: Neck pain for several months, no known injury,
initial encounter

EXAM:
CERVICAL SPINE - 3 VIEW

[c-spine lat]
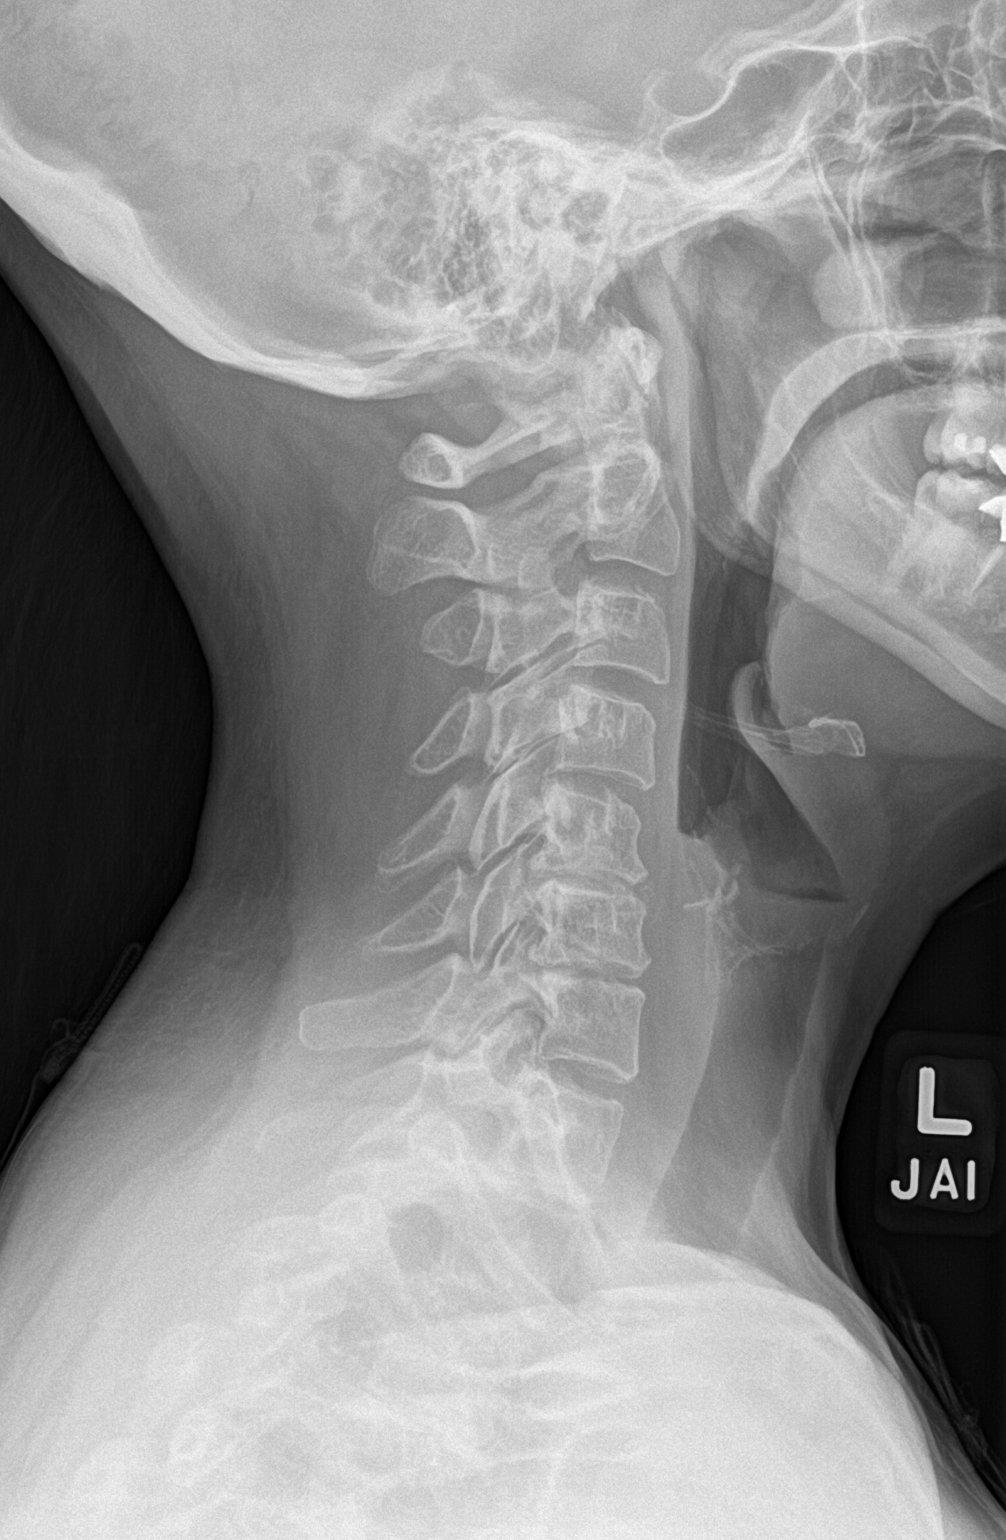

[c-spine ap]
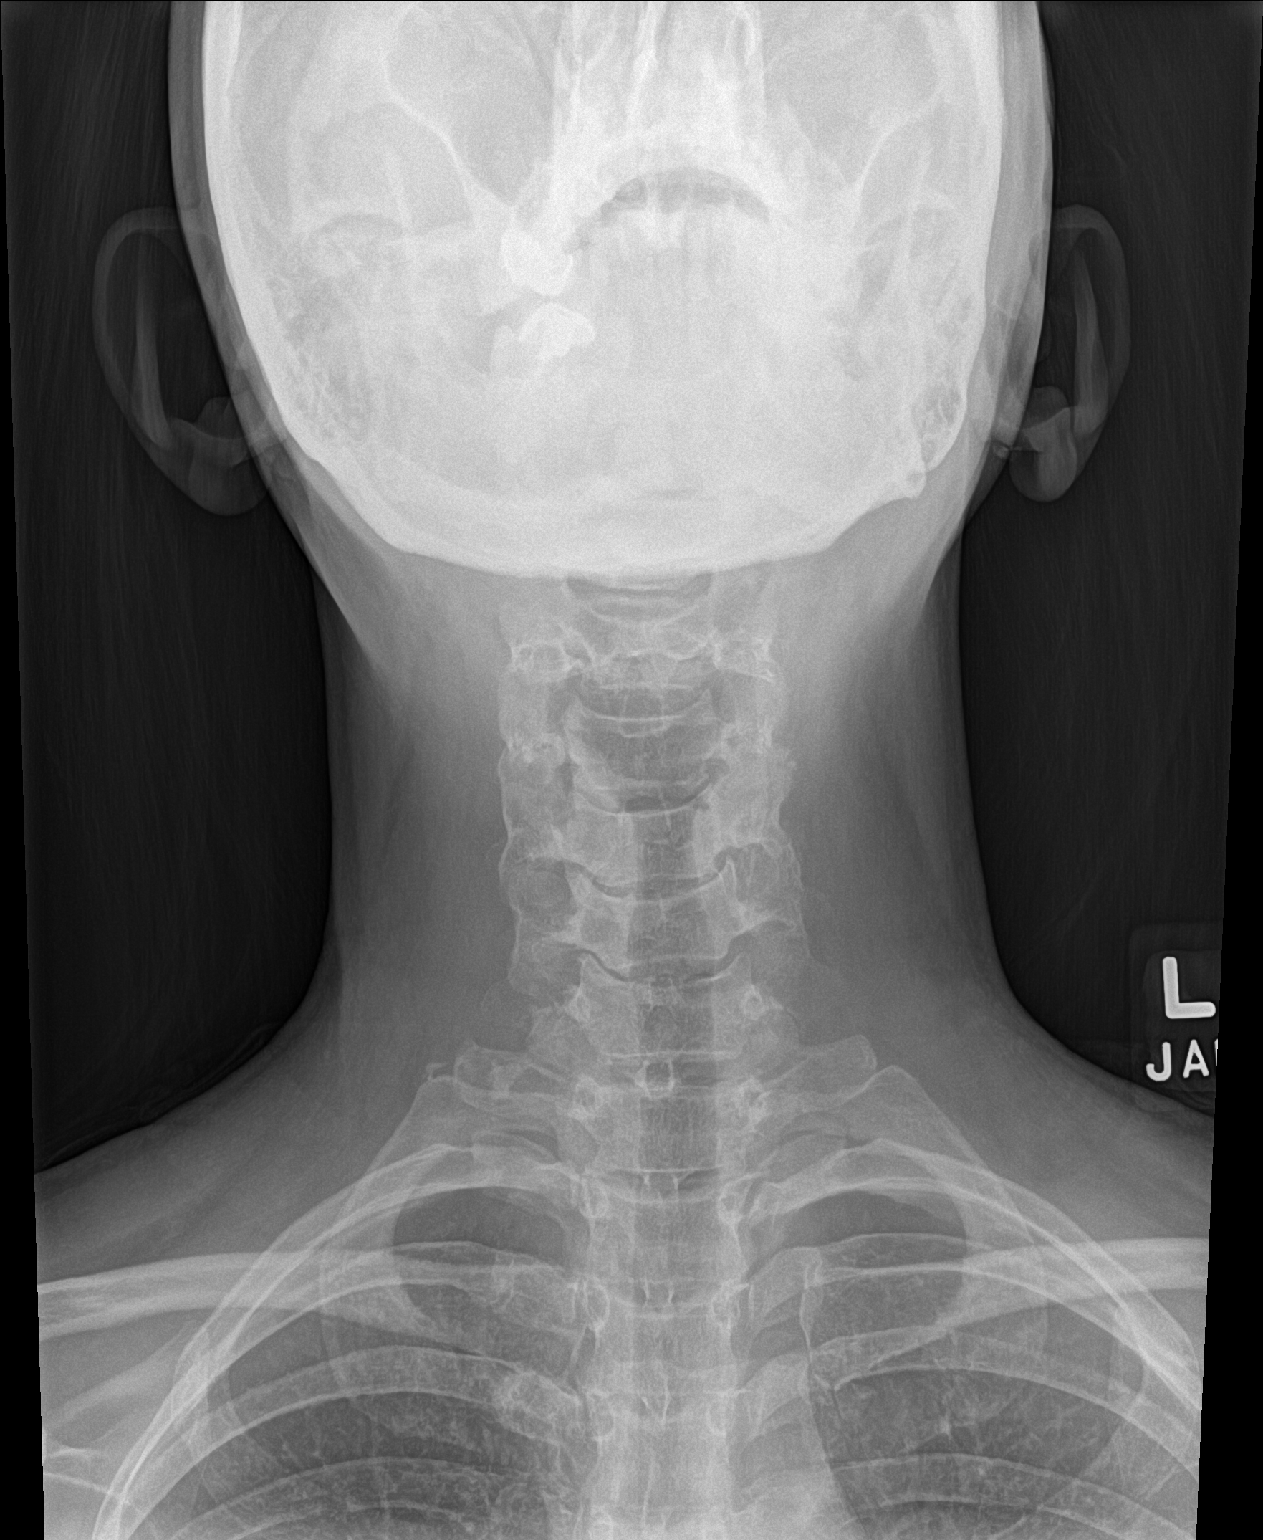

[c-spine open mouth]
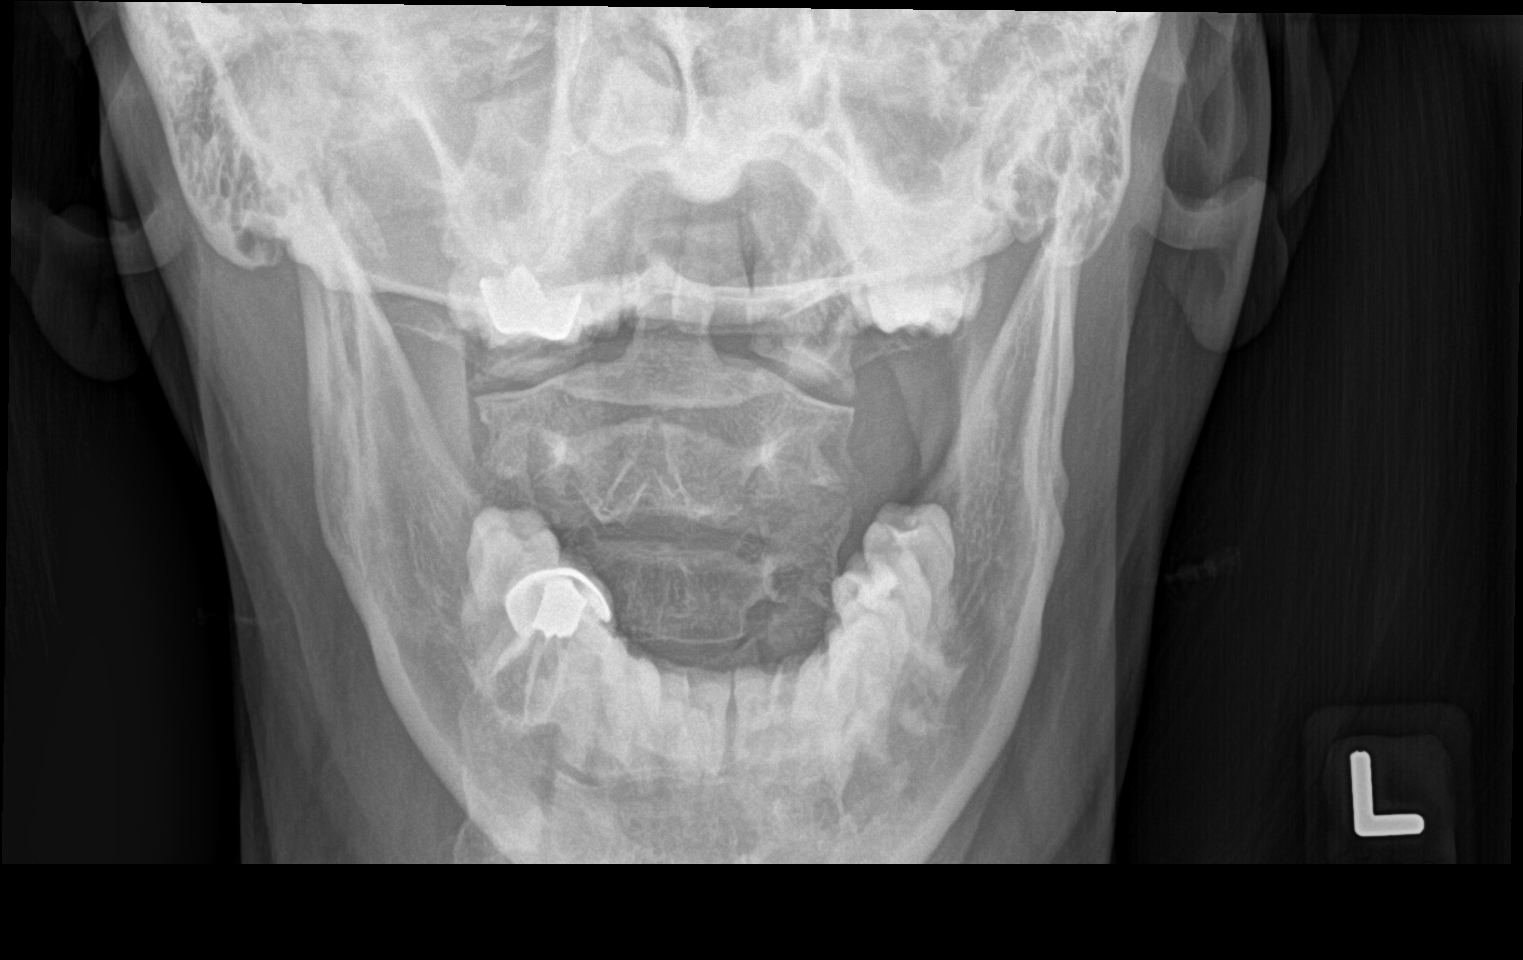

[3 of 3 positions shown; findings below may reference images not displayed]

FINDINGS: Seven cervical segments are well visualized. Vertebral body height
is well maintained. Disc space narrowing with osteophytic changes
are again seen at C5-6 and C6-7 slightly progressed when compared
with the prior exam. Mild facet hypertrophic changes are seen. The
odontoid is within normal limits. No soft tissue abnormality is
seen.
IMPRESSION: Mild progression in degenerative changes at C5-6 and C6-7.

## 2022-01-21 NOTE — Progress Notes (Deleted)
  [name] [address] [place] [address] Phone: [phone] Subjective:    I'm seeing this patient by the request  of:  Pincus Sanes, MD  CC:   [id number]  Madison [name] is a 51 y.o. female coming in with complaint of back and neck pain. OMT on 10/06/2021. Patient states   Medications patient has been prescribed: None  Taking:         Reviewed prior external information including notes and imaging from previsou exam, outside providers and external EMR if available.   As well as notes that were available from care everywhere and other healthcare systems.  Past medical history, social, surgical and family history all reviewed in electronic medical record.  No pertanent information unless stated regarding to the chief complaint.   Past Medical History:  Diagnosis Date   Migraine     Allergies  Allergen Reactions   Lactose Intolerance (Gi)     Gives her migraines     Review of Systems:  No headache, visual changes, nausea, vomiting, diarrhea, constipation, dizziness, abdominal pain, skin rash, fevers, chills, night sweats, weight loss, swollen lymph nodes, body aches, joint swelling, chest pain, shortness of breath, mood changes. POSITIVE muscle aches  Objective  There were no vitals taken for this visit.   General: No apparent distress alert and oriented x3 mood and affect normal, dressed appropriately.  HEENT: Pupils equal, extraocular movements intact  Respiratory: Patient's speak in full sentences and does not appear short of breath  Cardiovascular: No lower extremity edema, non tender, no erythema  Neuro: Cranial nerves II through XII are intact, neurovascularly intact in all extremities with 2+ DTRs and 2+ pulses.  Gait normal with good balance and coordination.  MSK:  Non tender with full range of motion and good stability and symmetric strength and tone of shoulders, elbows, wrist, hip, knee and ankles  bilaterally.  Back - Normal skin, Spine with normal alignment and no deformity.  No tenderness to vertebral process palpation.  Paraspinous muscles are not tender and without spasm.   Range of motion is full at neck and lumbar sacral regions  Osteopathic findings  C2 flexed rotated and side bent right C6 flexed rotated and side bent left T3 extended rotated and side bent right inhaled rib T9 extended rotated and side bent left L2 flexed rotated and side bent right Sacrum right on right       Assessment and Plan:    Nonallopathic problems  Decision today to treat with OMT was based on Physical Exam  After verbal consent patient was treated with HVLA, ME, FPR techniques in cervical, rib, thoracic, lumbar, and sacral  areas  Patient tolerated the procedure well with improvement in symptoms  Patient given exercises, stretches and lifestyle modifications  See medications in patient instructions if given  Patient will follow up in 4-8 weeks      The above documentation has been reviewed and is accurate and complete Lorne Skeens       Note: This dictation was prepared with Nurse, children's dictation along with smaller phrase technology. Any transcriptional errors that result from this process are unintentional.

## 2022-01-25 ENCOUNTER — Ambulatory Visit: Payer: BC Managed Care – PPO | Admitting: Family Medicine

## 2022-01-27 DIAGNOSIS — M542 Cervicalgia: Secondary | ICD-10-CM | POA: Diagnosis not present

## 2022-01-27 DIAGNOSIS — R5383 Other fatigue: Secondary | ICD-10-CM | POA: Diagnosis not present

## 2022-01-27 DIAGNOSIS — R5381 Other malaise: Secondary | ICD-10-CM | POA: Diagnosis not present

## 2022-01-27 DIAGNOSIS — E039 Hypothyroidism, unspecified: Secondary | ICD-10-CM | POA: Diagnosis not present

## 2022-02-01 DIAGNOSIS — H5032 Intermittent alternating esotropia: Secondary | ICD-10-CM | POA: Diagnosis not present

## 2022-02-17 NOTE — Progress Notes (Unsigned)
  Madison [name] [address] [place] Phone: [phone] Subjective:   Madison [name], am serving as a scribe for Dr. Hulan Saas.  I'm seeing this patient by the request  of:  Madison Rail, MD  CC: Neck pain follow-up  [id number]  Madison [name] is a 50 y.o. female coming in with complaint of back and neck pain. OMT 10/06/2021. Patient states same per usual. Left side low back and neck bothersome.  Mild new complaint of more of the low back.  Medications patient has been prescribed: None  Taking:         Reviewed prior external information including notes and imaging from previsou exam, outside providers and external EMR if available.   As well as notes that were available from care everywhere and other healthcare systems.  Past medical history, social, surgical and family history all reviewed in electronic medical record.  No pertanent information unless stated regarding to the chief complaint.   Past Medical History:  Diagnosis Date   Migraine     Allergies  Allergen Reactions   Lactose Intolerance (Gi)     Gives her migraines     Review of Systems:  No visual changes, nausea, vomiting, diarrhea, constipation, dizziness, abdominal pain, skin rash, fevers, chills, night sweats, weight loss, swollen lymph nodes, joint swelling, chest pain, shortness of breath, mood changes. POSITIVE muscle aches, body aches, headache  Objective  Blood pressure 118/78, pulse 76, height 5\' 4"  (1.626 m), weight 143 lb (64.9 kg), SpO2 98 %.   General: No apparent distress alert and oriented x3 mood and affect normal, dressed appropriately.  HEENT: Pupils equal, extraocular movements intact  Respiratory: Patient's speak in full sentences and does not appear short of breath  Cardiovascular: No lower extremity edema, non tender, no erythema  Neck exam does have some mild loss of lordosis.  Some tenderness to palpation.  Patient does  have tightness noted with the parascapular region left greater than right.  Patient is limited with sidebending.  Osteopathic findings  C2 flexed rotated and side bent left C6 flexed rotated and side bent left T3 extended rotated and side bent left inhaled rib T9 extended rotated and side bent left L2 flexed rotated and side bent right Sacrum left on left       Assessment and Plan:  Cervicogenic headache Chronic, overall.  Patient has been traveling recently and not being quite as active with her regular routine.  Discussed icing regimen and home exercises.  Which activities to do which ones to avoid.  Discussed proper lifting mechanics.  Patient wants to avoid taking any significant amount of medications.  Follow-up with me again in 6 to 8 weeks.   Nonallopathic problems  Decision today to treat with OMT was based on Physical Exam  After verbal consent patient was treated with HVLA, ME, FPR techniques in cervical, rib, thoracic, lumbar, and sacral  areas  Patient tolerated the procedure well with improvement in symptoms  Patient given exercises, stretches and lifestyle modifications  See medications in patient instructions if given  Patient will follow up in 4-8 weeks      The above documentation has been reviewed and is accurate and complete Lyndal Pulley, DO        Note: This dictation was prepared with Dragon dictation along with smaller phrase technology. Any transcriptional errors that result from this process are unintentional.

## 2022-02-18 ENCOUNTER — Ambulatory Visit (INDEPENDENT_AMBULATORY_CARE_PROVIDER_SITE_OTHER): Payer: BC Managed Care – PPO | Admitting: Family Medicine

## 2022-02-18 VITALS — BP 118/78 | HR 76 | Ht 64.0 in | Wt 143.0 lb

## 2022-02-18 DIAGNOSIS — M9904 Segmental and somatic dysfunction of sacral region: Secondary | ICD-10-CM

## 2022-02-18 DIAGNOSIS — M9901 Segmental and somatic dysfunction of cervical region: Secondary | ICD-10-CM | POA: Diagnosis not present

## 2022-02-18 DIAGNOSIS — M9902 Segmental and somatic dysfunction of thoracic region: Secondary | ICD-10-CM

## 2022-02-18 DIAGNOSIS — M9908 Segmental and somatic dysfunction of rib cage: Secondary | ICD-10-CM

## 2022-02-18 DIAGNOSIS — M9903 Segmental and somatic dysfunction of lumbar region: Secondary | ICD-10-CM | POA: Diagnosis not present

## 2022-02-18 DIAGNOSIS — G4486 Cervicogenic headache: Secondary | ICD-10-CM | POA: Diagnosis not present

## 2022-02-18 DIAGNOSIS — G5702 Lesion of sciatic nerve, left lower limb: Secondary | ICD-10-CM | POA: Diagnosis not present

## 2022-02-18 NOTE — Patient Instructions (Signed)
Good to see you! Good luck with the move See you again in 2-3 months

## 2022-02-18 NOTE — Assessment & Plan Note (Signed)
Chronic, overall.  Patient has been traveling recently and not being quite as active with her regular routine.  Discussed icing regimen and home exercises.  Which activities to do which ones to avoid.  Discussed proper lifting mechanics.  Patient wants to avoid taking any significant amount of medications.  Follow-up with me again in 6 to 8 weeks.

## 2022-02-18 NOTE — Assessment & Plan Note (Signed)
Mild increase in tightness noted today.  Responded well though to osteopathic manipulation.  Discussed stretching of the hip flexors and strengthening of the hip abductors again

## 2022-02-19 DIAGNOSIS — J209 Acute bronchitis, unspecified: Secondary | ICD-10-CM | POA: Diagnosis not present

## 2022-02-19 DIAGNOSIS — J0100 Acute maxillary sinusitis, unspecified: Secondary | ICD-10-CM | POA: Diagnosis not present

## 2022-03-01 ENCOUNTER — Telehealth: Payer: Self-pay | Admitting: *Deleted

## 2022-03-01 NOTE — Telephone Encounter (Signed)
Check status on PA still pending../lmb 

## 2022-03-01 NOTE — Telephone Encounter (Signed)
Rec'd fax PA pt needing PA on [employer]. Submitted via cover-my-meds w/ (Key: [id number]). Rec'd msg " [employer] is reviewing your PA request. You may close this dialog, return to your dashboard, and perform other tasks. To check for an update later"...Raechel Chute

## 2022-03-02 NOTE — Telephone Encounter (Signed)
Check status on PA rec'd msg med was APPROVED. It states This request has received a Favorable outcome from [place]. Effective from 03/01/2022 through 03/01/2023. Faxed approval to pof../lm,b

## 2022-04-20 ENCOUNTER — Ambulatory Visit: Payer: BC Managed Care – PPO | Admitting: Family Medicine

## 2022-05-10 DIAGNOSIS — H5032 Intermittent alternating esotropia: Secondary | ICD-10-CM | POA: Diagnosis not present

## 2022-05-17 NOTE — Progress Notes (Unsigned)
[name] [address] [place] [address] Phone: [phone] Subjective:    I'm seeing this patient by the request  of:  Pincus Sanes, MD  CC: Low back pain  [id number]  Madison [name] is a 51 y.o. female coming in with complaint of back and neck pain. OMT on 02/18/2022. Patient states that 2 weeks ago she was moving her daughter into college and believes that she injured her lower back. Has had massage and tried stretching. 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 Pain worse in morning and improves with movement. Pain is sharp at times. Denies any radiating symptoms. Used Baclofen a few times since injury to help with sleep.  Medications patient has been prescribed: None  Taking:         Reviewed prior external information including notes and imaging from previsou exam, outside providers and external EMR if available.   As well as notes that were available from care everywhere and other healthcare systems.  Past medical history, social, surgical and family history all reviewed in electronic medical record.  No pertanent information unless stated regarding to the chief complaint.   Past Medical History:  Diagnosis Date   Migraine     Allergies  Allergen Reactions   Lactose Intolerance (Gi)     Gives her migraines     Review of Systems:  No headache, visual changes, nausea, vomiting, diarrhea, constipation, dizziness, abdominal pain, skin rash, fevers, chills, night sweats, weight loss, swollen lymph nodes, body aches, joint swelling, chest pain, shortness of breath, mood changes. POSITIVE muscle aches  Objective  Blood pressure 116/82, pulse 68, height 5\' 4"  (1.626 m), weight 143 lb (64.9 kg), SpO2 97 %.   General: No apparent distress alert and oriented x3 mood and affect normal, dressed appropriately.  HEENT: Pupils equal, extraocular movements intact  Respiratory: Patient's speak in  full sentences and does not appear short of breath  Cardiovascular: No lower extremity edema, non tender, no erythema  Gait MSK:  Back low back exam does have significant tightness noted.  Seems to be more on the left side than the right.  Positive Faber on the left side.  Negative straight leg test.  Patient does have some difficulty with flexion greater than 60 degrees and extension greater than 10 degrees.  Significant pain with hip translation on the left sacroiliac joint  Osteopathic findings  C6 flexed rotated and side bent left T3 extended rotated and side bent right inhaled rib T8 extended rotated and side bent left L2 flexed rotated and side bent right Sacrum left on left       Assessment and Plan:  Piriformis syndrome, left Patient does have what appears to be some worsening back pain.  Seems to be more on the right side than her left piriformis at the moment.  Had significant difficulty with certain range of motion though noted.  Was lifting.  Could potentially also have some possible muscle injury.  No true radicular symptoms.  Responded extremelyWell to osteopathic manipulation.  Meloxicam given and warned of potential side effects.  Also discussed the possibility of need for muscle relaxers, and refilled baclofen which patient has had in the past.  Patient knows if worsening pain to seek medical attention but likely will do well with the conservative therapy and increase activity accordingly.  Follow-up with me again in 6 weeks    Nonallopathic problems  Decision today to treat with  OMT was based on Physical Exam  After verbal consent patient was treated with HVLA, ME, FPR techniques in cervical, rib, thoracic, lumbar, and sacral  areas  Patient tolerated the procedure well with improvement in symptoms  Patient given exercises, stretches and lifestyle modifications  See medications in patient instructions if given  Patient will follow up in 4-8 weeks     The  above documentation has been reviewed and is accurate and complete Judi Saa, DO         Note: This dictation was prepared with Dragon dictation along with smaller phrase technology. Any transcriptional errors that result from this process are unintentional.

## 2022-05-18 ENCOUNTER — Ambulatory Visit (INDEPENDENT_AMBULATORY_CARE_PROVIDER_SITE_OTHER): Payer: BC Managed Care – PPO | Admitting: Family Medicine

## 2022-05-18 VITALS — BP 116/82 | HR 68 | Ht 64.0 in | Wt 143.0 lb

## 2022-05-18 DIAGNOSIS — M9902 Segmental and somatic dysfunction of thoracic region: Secondary | ICD-10-CM

## 2022-05-18 DIAGNOSIS — G5702 Lesion of sciatic nerve, left lower limb: Secondary | ICD-10-CM

## 2022-05-18 DIAGNOSIS — M9903 Segmental and somatic dysfunction of lumbar region: Secondary | ICD-10-CM

## 2022-05-18 DIAGNOSIS — M9908 Segmental and somatic dysfunction of rib cage: Secondary | ICD-10-CM | POA: Diagnosis not present

## 2022-05-18 DIAGNOSIS — M9904 Segmental and somatic dysfunction of sacral region: Secondary | ICD-10-CM | POA: Diagnosis not present

## 2022-05-18 DIAGNOSIS — M9901 Segmental and somatic dysfunction of cervical region: Secondary | ICD-10-CM | POA: Diagnosis not present

## 2022-05-18 MED ORDER — MELOXICAM 15 MG PO TABS: 15.0000 mg | ORAL_TABLET | Freq: Every day | ORAL | 0 refills | Status: DC

## 2022-05-18 MED ORDER — BACLOFEN 10 MG PO TABS: 10.0000 mg | ORAL_TABLET | Freq: Three times a day (TID) | ORAL | 0 refills | Status: DC | PRN

## 2022-05-18 NOTE — Assessment & Plan Note (Signed)
Patient does have what appears to be some worsening back pain.  Seems to be more on the right side than her left piriformis at the moment.  Had significant difficulty with certain range of motion though noted.  Was lifting.  Could potentially also have some possible muscle injury.  No true radicular symptoms.  Responded extremelyWell to osteopathic manipulation.  Meloxicam given and warned of potential side effects.  Also discussed the possibility of need for muscle relaxers, and refilled baclofen which patient has had in the past.  Patient knows if worsening pain to seek medical attention but likely will do well with the conservative therapy and increase activity accordingly.  Follow-up with me again in 6 weeks

## 2022-05-18 NOTE — Patient Instructions (Addendum)
Good to see you Meloxicam 15mg  daily for 10 days then as needed Take Baclofen See me in 5-6 weeks

## 2022-06-28 NOTE — Progress Notes (Unsigned)
[place] Phone: [phone] Subjective:   Fontaine No, am serving as a scribe for Dr. Hulan Saas.  I'm seeing this patient by the request  of:  Binnie Rail, MD  CC: Neck pain, headache follow-up  [id number]  Madison [name] is a 51 y.o. female coming in with complaint of back and neck pain. OMT 05/18/2022. Also f/u for L hip pain. Patient states that she is having increase in pain on L side of cervical spine. Has been using Meloxicam. Also having pain in lower left lumbar.   Medications patient has been prescribed: Meloxicam, Baclofen  Taking:         Reviewed prior external information including notes and imaging from previsou exam, outside providers and external EMR if available.   As well as notes that were available from care everywhere and other healthcare systems.  Past medical history, social, surgical and family history all reviewed in electronic medical record.  No pertanent information unless stated regarding to the chief complaint.   Past Medical History:  Diagnosis Date   Migraine     Allergies  Allergen Reactions   Lactose Intolerance (Gi)     Gives her migraines     Review of Systems:  No headache, visual changes, nausea, vomiting, diarrhea, constipation, dizziness, abdominal pain, skin rash, fevers, chills, night sweats, weight loss, swollen lymph nodes, body aches, joint swelling, chest pain, shortness of breath, mood changes. POSITIVE muscle aches  Objective  Blood pressure 112/84, pulse 76, height 5\' 4"  (1.626 m), weight 140 lb (63.5 kg), SpO2 99 %.   General: No apparent distress alert and oriented x3 mood and affect normal, dressed appropriately.  HEENT: Pupils equal, extraocular movements intact  Respiratory: Patient's speak in full sentences and does not appear short of breath  Cardiovascular: No lower extremity edema, non tender, no erythema  Low back does  have a mild strain noted over the paraspinal musculature of the quadrant of the lumborum on the left side.  Patient does have tightness noted in the vicinity.  Mild positive FABER test.  Significant tightness noted in the neck area.  Osteopathic findings  C2 flexed rotated and side bent right C6 flexed rotated and side bent left T3 extended rotated and side bent right inhaled rib T9 extended rotated and side bent left L2 flexed rotated and side bent right Sacrum right on right       Assessment and Plan:  Cervicogenic headache Chronic problem with mild exacerbation with patient having worsening symptoms with moving boxes during furniture week.  Patient has meloxicam and we will refill can use as needed.  Discussed with patient about posture and ergonomics.  Patient still having some increasing stress that could also be playing a role.  Follow-up with me again in 4 to 6 weeks.    Nonallopathic problems  Decision today to treat with OMT was based on Physical Exam  After verbal consent patient was treated with HVLA, ME, FPR techniques in cervical, rib, thoracic, lumbar, and sacral  areas  Patient tolerated the procedure well with improvement in symptoms  Patient given exercises, stretches and lifestyle modifications  See medications in patient instructions if given  Patient will follow up in 4-8 weeks             Note: This dictation was prepared with Dragon dictation along with smaller phrase technology. Any transcriptional errors that result from this process are unintentional.

## 2022-06-29 ENCOUNTER — Ambulatory Visit (INDEPENDENT_AMBULATORY_CARE_PROVIDER_SITE_OTHER): Payer: BC Managed Care – PPO | Admitting: Family Medicine

## 2022-06-29 VITALS — BP 112/84 | HR 76 | Ht 64.0 in | Wt 140.0 lb

## 2022-06-29 DIAGNOSIS — G4486 Cervicogenic headache: Secondary | ICD-10-CM

## 2022-06-29 DIAGNOSIS — M9908 Segmental and somatic dysfunction of rib cage: Secondary | ICD-10-CM | POA: Diagnosis not present

## 2022-06-29 DIAGNOSIS — M9902 Segmental and somatic dysfunction of thoracic region: Secondary | ICD-10-CM

## 2022-06-29 DIAGNOSIS — M9901 Segmental and somatic dysfunction of cervical region: Secondary | ICD-10-CM

## 2022-06-29 DIAGNOSIS — M9904 Segmental and somatic dysfunction of sacral region: Secondary | ICD-10-CM

## 2022-06-29 DIAGNOSIS — M9903 Segmental and somatic dysfunction of lumbar region: Secondary | ICD-10-CM

## 2022-06-29 MED ORDER — MELOXICAM 15 MG PO TABS: 15.0000 mg | ORAL_TABLET | Freq: Every day | ORAL | 0 refills | Status: DC

## 2022-06-29 NOTE — Patient Instructions (Signed)
Great to see you Easy to say but try to not let stress get to you See me in 6-8 weeks

## 2022-06-29 NOTE — Assessment & Plan Note (Signed)
Chronic problem with mild exacerbation with patient having worsening symptoms with moving boxes during furniture week.  Patient has meloxicam and we will refill can use as needed.  Discussed with patient about posture and ergonomics.  Patient still having some increasing stress that could also be playing a role.  Follow-up with me again in 4 to 6 weeks.

## 2022-08-10 NOTE — Progress Notes (Unsigned)
[name] [address] [place] [address] Phone: [phone] Subjective:   Madison [name], am serving as a scribe for Dr. Antoine Primas.  I'm seeing this patient by the request  of:  Pincus Sanes, MD  CC: neck and back pain follow up   [id number]  Madison [name] is a 51 y.o. female coming in with complaint of back and neck pain. OMT 06/29/2022. Patient states that she continues to have tightness on L side of shoulder, head, and neck. Took migraine medication last night and this did not help. Also feels pain radiating down into T and L spine, L hip and all the way to the L foot. Foot will twitch and spasms. No new injury. Has been on ladders at work no injury though.   Medications patient has been prescribed: Meloxicam  Taking:         Reviewed prior external information including notes and imaging from previsou exam, outside providers and external EMR if available.   As well as notes that were available from care everywhere and other healthcare systems.  Past medical history, social, surgical and family history all reviewed in electronic medical record.  No pertanent information unless stated regarding to the chief complaint.   Past Medical History:  Diagnosis Date   Migraine     Allergies  Allergen Reactions   Lactose Intolerance (Gi)     Gives her migraines     Review of Systems:  No  visual changes, nausea, vomiting, diarrhea, constipation, dizziness, abdominal pain, skin rash, fevers, chills, night sweats, weight loss, swollen lymph nodes,, joint swelling, chest pain, shortness of breath, mood changes. POSITIVE muscle aches, body aches, headache  Objective  Blood pressure 120/88, pulse 93, height 5\' 4"  (1.626 m), weight 139 lb (63 kg), SpO2 98 %.   General: No apparent distress alert and oriented x3 mood and affect normal, dressed appropriately.  HEENT: Pupils equal, extraocular movements intact  Respiratory:  Patient's speak in full sentences and does not appear short of breath  Cardiovascular: No lower extremity edema, non tender, no erythema  Neck exam does have some loss of lordosis.  Some tightness noted in the parascapular region bilaterally seems to be a little bit worse on the right compared to left.  Mild limited right sidebending of the neck right with limited rotation to the left.  Osteopathic findings  C2 flexed rotated and side bent right C6 flexed rotated and side bent left T3 extended rotated and side bent right inhaled rib T8 extended rotated and side bent left     Assessment and Plan:  Cervicogenic headache Neck still tightness with headaches, discussed Ibuprofen regularly.  We discussed which activities to do and which ones to avoid.  Increase activity slowly otherwise.  Discussed with patient to limit sidebending bilaterally.  Follow-up again in 6 to 8 weeks.  We discussed meloxicam 15 mg daily as well.    Nonallopathic problems  Decision today to treat with OMT was based on Physical Exam  After verbal consent patient was treated with HVLA, ME, FPR techniques in cervical, rib, thoracic areas  Patient tolerated the procedure well with improvement in symptoms  Patient given exercises, stretches and lifestyle modifications  See medications in patient instructions if given  Patient will follow up in 4-8 weeks     The above documentation has been reviewed and is accurate and complete , DO         Note: This dictation was  prepared with Dragon dictation along with smaller phrase technology. Any transcriptional errors that result from this process are unintentional.

## 2022-08-16 ENCOUNTER — Ambulatory Visit (INDEPENDENT_AMBULATORY_CARE_PROVIDER_SITE_OTHER): Payer: BC Managed Care – PPO | Admitting: Family Medicine

## 2022-08-16 ENCOUNTER — Encounter: Payer: Self-pay | Admitting: Family Medicine

## 2022-08-16 VITALS — BP 120/88 | HR 93 | Ht 64.0 in | Wt 139.0 lb

## 2022-08-16 DIAGNOSIS — M9902 Segmental and somatic dysfunction of thoracic region: Secondary | ICD-10-CM

## 2022-08-16 DIAGNOSIS — G4486 Cervicogenic headache: Secondary | ICD-10-CM | POA: Diagnosis not present

## 2022-08-16 DIAGNOSIS — M9901 Segmental and somatic dysfunction of cervical region: Secondary | ICD-10-CM | POA: Diagnosis not present

## 2022-08-16 DIAGNOSIS — M9908 Segmental and somatic dysfunction of rib cage: Secondary | ICD-10-CM | POA: Diagnosis not present

## 2022-08-16 NOTE — Assessment & Plan Note (Addendum)
Neck still tightness with headaches, discussed Ibuprofen regularly.  We discussed which activities to do and which ones to avoid.  Increase activity slowly otherwise.  Discussed with patient to limit sidebending bilaterally.  Follow-up again in 6 to 8 weeks.  We discussed meloxicam 15 mg daily as well.

## 2022-08-16 NOTE — Patient Instructions (Signed)
Good to see you 600mg  of IBU tonight and tmrw See me again in 5-6 weeks

## 2022-08-24 DIAGNOSIS — E039 Hypothyroidism, unspecified: Secondary | ICD-10-CM | POA: Diagnosis not present

## 2022-08-24 DIAGNOSIS — M542 Cervicalgia: Secondary | ICD-10-CM | POA: Diagnosis not present

## 2022-08-24 DIAGNOSIS — R5381 Other malaise: Secondary | ICD-10-CM | POA: Diagnosis not present

## 2022-08-24 DIAGNOSIS — R519 Headache, unspecified: Secondary | ICD-10-CM | POA: Diagnosis not present

## 2022-08-24 DIAGNOSIS — F411 Generalized anxiety disorder: Secondary | ICD-10-CM | POA: Diagnosis not present

## 2022-09-22 NOTE — Progress Notes (Deleted)
  [place] Phone: [phone] Subjective:    I'm seeing this patient by the request  of:  Burns, Claudina Lick, MD  CC:   [id number]  Madison [name] is a 52 y.o. female coming in with complaint of back and neck pain. OMT 08/16/2022. Patient states   Medications patient has been prescribed: Meloxicam, Baclofen  Taking:         Reviewed prior external information including notes and imaging from previsou exam, outside providers and external EMR if available.   As well as notes that were available from care everywhere and other healthcare systems.  Past medical history, social, surgical and family history all reviewed in electronic medical record.  No pertanent information unless stated regarding to the chief complaint.   Past Medical History:  Diagnosis Date   Migraine     Allergies  Allergen Reactions   Lactose Intolerance (Gi)     Gives her migraines     Review of Systems:  No headache, visual changes, nausea, vomiting, diarrhea, constipation, dizziness, abdominal pain, skin rash, fevers, chills, night sweats, weight loss, swollen lymph nodes, body aches, joint swelling, chest pain, shortness of breath, mood changes. POSITIVE muscle aches  Objective  There were no vitals taken for this visit.   General: No apparent distress alert and oriented x3 mood and affect normal, dressed appropriately.  HEENT: Pupils equal, extraocular movements intact  Respiratory: Patient's speak in full sentences and does not appear short of breath  Cardiovascular: No lower extremity edema, non tender, no erythema  Gait MSK:  Back   Osteopathic findings  C2 flexed rotated and side bent right C6 flexed rotated and side bent left T3 extended rotated and side bent right inhaled rib T9 extended rotated and side bent left L2 flexed rotated and side bent right Sacrum right on right       Assessment and  Plan:  No problem-specific Assessment & Plan notes found for this encounter.    Nonallopathic problems  Decision today to treat with OMT was based on Physical Exam  After verbal consent patient was treated with HVLA, ME, FPR techniques in cervical, rib, thoracic, lumbar, and sacral  areas  Patient tolerated the procedure well with improvement in symptoms  Patient given exercises, stretches and lifestyle modifications  See medications in patient instructions if given  Patient will follow up in 4-8 weeks             Note: This dictation was prepared with Dragon dictation along with smaller phrase technology. Any transcriptional errors that result from this process are unintentional.

## 2022-09-26 DIAGNOSIS — M542 Cervicalgia: Secondary | ICD-10-CM | POA: Diagnosis not present

## 2022-09-26 DIAGNOSIS — E039 Hypothyroidism, unspecified: Secondary | ICD-10-CM | POA: Diagnosis not present

## 2022-09-26 DIAGNOSIS — R519 Headache, unspecified: Secondary | ICD-10-CM | POA: Diagnosis not present

## 2022-09-26 DIAGNOSIS — R5381 Other malaise: Secondary | ICD-10-CM | POA: Diagnosis not present

## 2022-09-28 ENCOUNTER — Ambulatory Visit: Payer: BC Managed Care – PPO | Admitting: Family Medicine

## 2022-10-18 NOTE — Progress Notes (Unsigned)
  Madison Madison [name] Phone: [phone] Subjective:   Madison Madison [name], am serving as a scribe for Dr. Hulan Saas.  I'm seeing this patient by the request  of:  Madison Rail, MD  CC: Neck and back pain follow-up  [id number]  Madison Madison [name] is a 52 y.o. female coming in with complaint of back and neck pain. Omt on 08/16/2022. Patient states that she had a migraine last week and notices tightness in neck and lumbar spine on L side.   Medications patient has been prescribed: None  Taking:         Reviewed prior external information including notes and imaging from previsou exam, outside providers and external EMR if available.   As well as notes that were available from care everywhere and other healthcare systems.  Past medical history, social, surgical and family history all reviewed in electronic medical record.  Madison [name] pertanent information unless stated regarding to the chief complaint.   Past Medical History:  Diagnosis Date   Migraine     Allergies  Allergen Reactions   Lactose Intolerance (Gi)     Gives her migraines     Review of Systems:  Madison [name] headache, visual changes, nausea, vomiting, diarrhea, constipation, dizziness, abdominal pain, skin rash, fevers, chills, night sweats, weight loss, swollen lymph nodes, body aches, joint swelling, chest pain, shortness of breath, mood changes. POSITIVE muscle aches  Objective  Blood pressure 120/88, pulse 68, height 5\' 4"  (1.626 m), weight 140 lb (63.5 kg), SpO2 98 %.   General: Madison [name] apparent distress alert and oriented x3 mood and affect normal, dressed appropriately.  HEENT: Pupils equal, extraocular movements intact  Respiratory: Patient's speak in full sentences and does not appear short of breath  Cardiovascular: Madison [name] lower extremity edema, non tender, Madison [name] erythema  Neck exam still has significant tightness noted.  Seems to be more on the left side.   Tightness in the left parascapular area as well.  Osteopathic findings  C6 flexed rotated and side bent left T4 extended rotated and side bent left inhaled rib T9 extended rotated and side bent left inhaled rib L2 flexed rotated and side bent right Sacrum right on right       Assessment and Plan:  Cervicogenic headache Chronic problem with mild exacerbation, secondary to weather, work and stress  Discussed ergonomics and other things that could be beneficial.  Discussed icing regimen and home exercises.  Increase activity slowly.  Follow-up with me again in 6 to 8 weeks    Nonallopathic problems  Decision today to treat with OMT was based on Physical Exam  After verbal consent patient was treated with HVLA, ME, FPR techniques in cervical, rib, thoracic, lumbar, and sacral  areas  Patient tolerated the procedure well with improvement in symptoms  Patient given exercises, stretches and lifestyle modifications  See medications in patient instructions if given  Patient will follow up in 4-8 weeks    The above documentation has been reviewed and is accurate and complete Madison Pulley, DO          Note: This dictation was prepared with Dragon dictation along with smaller phrase technology. Any transcriptional errors that result from this process are unintentional.

## 2022-10-19 ENCOUNTER — Ambulatory Visit (INDEPENDENT_AMBULATORY_CARE_PROVIDER_SITE_OTHER): Payer: BC Managed Care – PPO | Admitting: Family Medicine

## 2022-10-19 VITALS — BP 120/88 | HR 68 | Ht 64.0 in | Wt 140.0 lb

## 2022-10-19 DIAGNOSIS — M9904 Segmental and somatic dysfunction of sacral region: Secondary | ICD-10-CM | POA: Diagnosis not present

## 2022-10-19 DIAGNOSIS — M9903 Segmental and somatic dysfunction of lumbar region: Secondary | ICD-10-CM | POA: Diagnosis not present

## 2022-10-19 DIAGNOSIS — M9902 Segmental and somatic dysfunction of thoracic region: Secondary | ICD-10-CM

## 2022-10-19 DIAGNOSIS — G4486 Cervicogenic headache: Secondary | ICD-10-CM

## 2022-10-19 DIAGNOSIS — M9901 Segmental and somatic dysfunction of cervical region: Secondary | ICD-10-CM | POA: Diagnosis not present

## 2022-10-19 DIAGNOSIS — M9908 Segmental and somatic dysfunction of rib cage: Secondary | ICD-10-CM

## 2022-10-19 NOTE — Patient Instructions (Signed)
Good to see you Keep changing positions with work See me in 5-8 weeks

## 2022-10-19 NOTE — Assessment & Plan Note (Signed)
Chronic problem with mild exacerbation, secondary to weather, work and stress  Discussed ergonomics and other things that could be beneficial.  Discussed icing regimen and home exercises.  Increase activity slowly.  Follow-up with me again in 6 to 8 weeks

## 2022-11-29 DIAGNOSIS — N943 Premenstrual tension syndrome: Secondary | ICD-10-CM | POA: Diagnosis not present

## 2022-11-29 DIAGNOSIS — R5381 Other malaise: Secondary | ICD-10-CM | POA: Diagnosis not present

## 2022-11-29 NOTE — Progress Notes (Deleted)
  [facility] Phone: [phone] Subjective:    I'm seeing this patient by the request  of:  Burns, Claudina Lick, MD  CC:   [id number]  Madison [name] is a 52 y.o. female coming in with complaint of back and neck pain. OMT on 10/19/2022. Patient states   Medications patient has been prescribed:   Taking:         Reviewed prior external information including notes and imaging from previsou exam, outside providers and external EMR if available.   As well as notes that were available from care everywhere and other healthcare systems.  Past medical history, social, surgical and family history all reviewed in electronic medical record.  No pertanent information unless stated regarding to the chief complaint.   Past Medical History:  Diagnosis Date   Migraine     Allergies  Allergen Reactions   Lactose Intolerance (Gi)     Gives her migraines     Review of Systems:  No headache, visual changes, nausea, vomiting, diarrhea, constipation, dizziness, abdominal pain, skin rash, fevers, chills, night sweats, weight loss, swollen lymph nodes, body aches, joint swelling, chest pain, shortness of breath, mood changes. POSITIVE muscle aches  Objective  There were no vitals taken for this visit.   General: No apparent distress alert and oriented x3 mood and affect normal, dressed appropriately.  HEENT: Pupils equal, extraocular movements intact  Respiratory: Patient's speak in full sentences and does not appear short of breath  Cardiovascular: No lower extremity edema, non tender, no erythema  Gait MSK:  Back   Osteopathic findings  C2 flexed rotated and side bent right C6 flexed rotated and side bent left T3 extended rotated and side bent right inhaled rib T9 extended rotated and side bent left L2 flexed rotated and side bent right Sacrum right on right       Assessment and Plan:  No  problem-specific Assessment & Plan notes found for this encounter.    Nonallopathic problems  Decision today to treat with OMT was based on Physical Exam  After verbal consent patient was treated with HVLA, ME, FPR techniques in cervical, rib, thoracic, lumbar, and sacral  areas  Patient tolerated the procedure well with improvement in symptoms  Patient given exercises, stretches and lifestyle modifications  See medications in patient instructions if given  Patient will follow up in 4-8 weeks             Note: This dictation was prepared with Dragon dictation along with smaller phrase technology. Any transcriptional errors that result from this process are unintentional.

## 2022-11-30 ENCOUNTER — Ambulatory Visit: Payer: BC Managed Care – PPO | Admitting: Family Medicine

## 2022-12-01 DIAGNOSIS — D235 Other benign neoplasm of skin of trunk: Secondary | ICD-10-CM | POA: Diagnosis not present

## 2022-12-01 DIAGNOSIS — D485 Neoplasm of uncertain behavior of skin: Secondary | ICD-10-CM | POA: Diagnosis not present

## 2022-12-01 DIAGNOSIS — Z129 Encounter for screening for malignant neoplasm, site unspecified: Secondary | ICD-10-CM | POA: Diagnosis not present

## 2022-12-01 DIAGNOSIS — D2371 Other benign neoplasm of skin of right lower limb, including hip: Secondary | ICD-10-CM | POA: Diagnosis not present

## 2022-12-01 DIAGNOSIS — D225 Melanocytic nevi of trunk: Secondary | ICD-10-CM | POA: Diagnosis not present

## 2022-12-01 DIAGNOSIS — L814 Other melanin hyperpigmentation: Secondary | ICD-10-CM | POA: Diagnosis not present

## 2023-01-03 DIAGNOSIS — D2371 Other benign neoplasm of skin of right lower limb, including hip: Secondary | ICD-10-CM | POA: Diagnosis not present

## 2023-01-11 ENCOUNTER — Other Ambulatory Visit: Payer: Self-pay | Admitting: Family Medicine

## 2023-01-12 NOTE — Progress Notes (Signed)
  [name] [address] [place] [address] Phone: [phone] Subjective:   INadine Counts, am serving as a scribe for Dr. Antoine Primas.  I'm seeing this patient by the request  of:  Pincus Sanes, MD  CC: neck pain and back pain   [id number]  Madison [name] is a 52 y.o. female coming in with complaint of back and neck pain. OMT on 10/18/2022. Patient states doing well. Same per usual. No new concerns.  Medications patient has been prescribed:   Taking:         Reviewed prior external information including notes and imaging from previsou exam, outside providers and external EMR if available.   As well as notes that were available from care everywhere and other healthcare systems.  Past medical history, social, surgical and family history all reviewed in electronic medical record.  No pertanent information unless stated regarding to the chief complaint.   Past Medical History:  Diagnosis Date   Migraine     Allergies  Allergen Reactions   Lactose Intolerance (Gi)     Gives her migraines     Review of Systems:  No headache, visual changes, nausea, vomiting, diarrhea, constipation, dizziness, abdominal pain, skin rash, fevers, chills, night sweats, weight loss, swollen lymph nodes, body aches, joint swelling, chest pain, shortness of breath, mood changes. POSITIVE muscle aches  Objective  Blood pressure 120/74, pulse 84, height 5\' 4"  (1.626 m), weight 137 lb (62.1 kg), SpO2 98 %.   General: No apparent distress alert and oriented x3 mood and affect normal, dressed appropriately.  HEENT: Pupils equal, extraocular movements intact  Respiratory: Patient's speak in full sentences and does not appear short of breath  Cardiovascular: No lower extremity edema, non tender, no erythema  Patient's neck exam does have significant tightness noted.  Left greater than right.  Tightness with sidebending bilaterally.  Osteopathic  findings  C2 flexed rotated and side bent left C7 flexed rotated and side bent left T3 extended rotated and side bent left inhaled rib T8 extended rotated and side bent left L2 flexed rotated and side bent right Sacrum right on right       Assessment and Plan:  Cervicogenic headache Cervicogenic headaches, no radicular symptoms at this moment.  Will monitor for any type of migraines.  With change of seasons patient has had difficulties with this previously.  Discussed home exercises and icing regimen.  Increase activity slowly.  Follow-up again in 6 to 8 weeks    Nonallopathic problems  Decision today to treat with OMT was based on Physical Exam  After verbal consent patient was treated with HVLA, ME, FPR techniques in cervical, rib, thoracic, lumbar, and sacral  areas  Patient tolerated the procedure well with improvement in symptoms  Patient given exercises, stretches and lifestyle modifications  See medications in patient instructions if given  Patient will follow up in 4-8 weeks    The above documentation has been reviewed and is accurate and complete Judi Saa, DO          Note: This dictation was prepared with Dragon dictation along with smaller phrase technology. Any transcriptional errors that result from this process are unintentional.

## 2023-01-14 ENCOUNTER — Other Ambulatory Visit: Payer: Self-pay | Admitting: Internal Medicine

## 2023-01-14 DIAGNOSIS — G43511 Persistent migraine aura without cerebral infarction, intractable, with status migrainosus: Secondary | ICD-10-CM

## 2023-01-16 ENCOUNTER — Encounter: Payer: Self-pay | Admitting: Internal Medicine

## 2023-01-16 NOTE — Patient Instructions (Addendum)
Blood work was ordered.   The lab is on the first floor.    Medications changes include :       A referral was ordered for XXX.     Someone will call you to schedule an appointment.    Return in about 1 year (around 01/17/2024) for Physical Exam.   Health Maintenance, Female Adopting a healthy lifestyle and getting preventive care are important in promoting health and wellness. Ask your health care provider about: The right schedule for you to have regular tests and exams. Things you can do on your own to prevent diseases and keep yourself healthy. What should I know about diet, weight, and exercise? Eat a healthy diet  Eat a diet that includes plenty of vegetables, fruits, low-fat dairy products, and lean protein. Do not eat a lot of foods that are high in solid fats, added sugars, or sodium. Maintain a healthy weight Body mass index (BMI) is used to identify weight problems. It estimates body fat based on height and weight. Your health care provider can help determine your BMI and help you achieve or maintain a healthy weight. Get regular exercise Get regular exercise. This is one of the most important things you can do for your health. Most adults should: Exercise for at least 150 minutes each week. The exercise should increase your heart rate and make you sweat (moderate-intensity exercise). Do strengthening exercises at least twice a week. This is in addition to the moderate-intensity exercise. Spend less time sitting. Even light physical activity can be beneficial. Watch cholesterol and blood lipids Have your blood tested for lipids and cholesterol at 53 years of age, then have this test every 5 years. Have your cholesterol levels checked more often if: Your lipid or cholesterol levels are high. You are older than 53 years of age. You are at high risk for heart disease. What should I know about cancer screening? Depending on your health history and family history,  you may need to have cancer screening at various ages. This may include screening for: Breast cancer. Cervical cancer. Colorectal cancer. Skin cancer. Lung cancer. What should I know about heart disease, diabetes, and high blood pressure? Blood pressure and heart disease High blood pressure causes heart disease and increases the risk of stroke. This is more likely to develop in people who have high blood pressure readings or are overweight. Have your blood pressure checked: Every 3-5 years if you are 7-58 years of age. Every year if you are 61 years old or older. Diabetes Have regular diabetes screenings. This checks your fasting blood sugar level. Have the screening done: Once every three years after age 36 if you are at a normal weight and have a low risk for diabetes. More often and at a younger age if you are overweight or have a high risk for diabetes. What should I know about preventing infection? Hepatitis B If you have a higher risk for hepatitis B, you should be screened for this virus. Talk with your health care provider to find out if you are at risk for hepatitis B infection. Hepatitis C Testing is recommended for: Everyone born from [birth year] through 1965. Anyone with known risk factors for hepatitis C. Sexually transmitted infections (STIs) Get screened for STIs, including gonorrhea and chlamydia, if: You are sexually active and are younger than 52 years of age. You are older than 52 years of age and your health care provider tells you that you are at  increased risk for chlamydia or gonorrhea. Ask your health care provider if you are at risk. Ask your health care provider about whether you are at high risk for HIV. Your health care provider may recommend a prescription medicine to help prevent HIV infection. If you choose to take medicine to prevent HIV, you should first get tested for HIV. You should then be tested every 3 months for as long as you  are taking the medicine. Pregnancy If you are about to stop having your period (premenopausal) and you may become pregnant, seek counseling before you get pregnant. Take 400 to 800 micrograms (mcg) of folic acid every day if you become pregnant. Ask for birth control (contraception) if you want to prevent pregnancy. Osteoporosis and menopause Osteoporosis is a disease in which the bones lose minerals and strength with aging. This can result in bone fractures. If you are 65 years old or older, or if you are at risk for osteoporosis and fractures, ask your health care provider if you should: Be screened for bone loss. Take a calcium or vitamin D supplement to lower your risk of fractures. Be given hormone replacement therapy (HRT) to treat symptoms of menopause. Follow these instructions at home: Alcohol use Do not drink alcohol if: Your health care provider tells you not to drink. You are pregnant, may be pregnant, or are planning to become pregnant. If you drink alcohol: Limit how much you have to: 0-1 drink a day. Know how much alcohol is in your drink. In the [place] one drink equals one 12 oz bottle of beer (355 mL), one 5 oz glass of wine (148 mL), or one 1 oz glass of hard liquor (44 mL). Lifestyle Do not use any products that contain nicotine or tobacco. These products include cigarettes, chewing tobacco, and vaping devices, such as e-cigarettes. If you need help quitting, ask your health care provider. Do not use street drugs. Do not share needles. Ask your health care provider for help if you need support or information about quitting drugs. General instructions Schedule regular health, dental, and eye exams. Stay current with your vaccines. Tell your health care provider if: You often feel depressed. You have ever been abused or do not feel safe at home. Summary Adopting a healthy lifestyle and getting preventive care are important in promoting health and wellness. Follow your  health care provider's instructions about healthy diet, exercising, and getting tested or screened for diseases. Follow your health care provider's instructions on monitoring your cholesterol and blood pressure. This information is not intended to replace advice given to you by your health care provider. Make sure you discuss any questions you have with your health care provider. Document Revised: 01/25/2021 Document Reviewed: 01/25/2021 Elsevier Patient Education  2023 Elsevier Inc.  

## 2023-01-16 NOTE — Progress Notes (Unsigned)
Subjective:    Patient ID: Madison [name], female    DOB: 1971-07-05, 52 y.o.   MRN: [id number]      HPI Madison [name] is here for a Physical exam and her chronic medical problems.    Perimenopausal - going to integrative health and are on hormones which has been helping.     Medications and allergies reviewed with patient and updated if appropriate.  Current Outpatient Medications on File Prior to Visit  Medication Sig Dispense Refill   ARMOUR THYROID 30 MG tablet Take 30 mg by mouth daily.     ARMOUR THYROID 60 MG tablet Take by mouth.     baclofen (LIORESAL) 10 MG tablet Take 1 tablet (10 mg total) by mouth 3 (three) times daily as needed for muscle spasms. 90 each 0   Biotin 5000 MCG CAPS Take by mouth.     Cholecalciferol (VITAMIN D3) 1000 units CAPS Take 1 capsule by mouth daily.     MAGNESIUM MALATE PO Take by mouth.     meloxicam (MOBIC) 15 MG tablet TAKE 1 TABLET(15 MG) BY MOUTH DAILY 90 tablet 0   Menaquinone-7 (VITAMIN K2) 100 MCG CAPS Take 1 capsule by mouth daily.     Multiple Vitamin (MULTIVITAMIN) tablet Take 1 tablet by mouth daily.     Probiotic Product (PROBIOTIC-10 PO) Take 1 capsule by mouth daily.     progesterone (PROMETRIUM) 100 MG capsule Take 100 mg by mouth at bedtime.     RESTASIS 0.05 % ophthalmic emulsion      Ubrogepant (UBRELVY) 50 MG TABS TAKE 1 TABLET BY MOUTH 1 TIME AS NEEDED FOR MIGRAINE. MAY REPEAT 1 TIME AFTER 2 HOURS 10 tablet 11   Zinc 30 MG TABS Take by mouth.     progesterone (PROMETRIUM) 200 MG capsule Take 200 mg by mouth daily. (Patient not taking: Reported on 01/17/2023)     No current facility-administered medications on file prior to visit.    Review of Systems  Constitutional:  Negative for fever.  Eyes:  Negative for visual disturbance.  Respiratory:  Negative for cough, shortness of breath and wheezing.   Cardiovascular:  Negative for chest pain, palpitations and leg swelling.  Gastrointestinal:  Negative for abdominal  pain, blood in stool, constipation and diarrhea.       No gerd  Genitourinary:  Negative for dysuria.  Musculoskeletal:  Negative for arthralgias and back pain.  Skin:  Negative for rash.  Neurological:  Positive for headaches (migraines). Negative for light-headedness.  Psychiatric/Behavioral:  Negative for dysphoric mood. The patient is not nervous/anxious.        Objective:   Vitals:   01/17/23 1101  BP: 114/72  Pulse: 63  Temp: 98.3 F (36.8 C)  SpO2: 98%   Filed Weights   01/17/23 1101  Weight: 137 lb (62.1 kg)   Body mass index is 23.52 kg/m.  BP Readings from Last 3 Encounters:  01/17/23 114/72  10/19/22 120/88  08/16/22 120/88    Wt Readings from Last 3 Encounters:  01/17/23 137 lb (62.1 kg)  10/19/22 140 lb (63.5 kg)  08/16/22 139 lb (63 kg)       Physical Exam Constitutional: She appears well-developed and well-nourished. No distress.  HENT:  Head: Normocephalic and atraumatic.  Right Ear: External ear normal. Normal ear canal and TM Left Ear: External ear normal.  Normal ear canal and TM Mouth/Throat: Oropharynx is clear and moist.  Eyes: Conjunctivae normal.  Neck: Neck supple. No tracheal deviation present. No  thyromegaly present.  No carotid bruit  Cardiovascular: Normal rate, regular rhythm and normal heart sounds.   No murmur heard.  No edema. Pulmonary/Chest: Effort normal and breath sounds normal. No respiratory distress. She has no wheezes. She has no rales.  Breast: deferred   Abdominal: Soft. She exhibits no distension. There is no tenderness.  Lymphadenopathy: She has no cervical adenopathy.  Skin: Skin is warm and dry. She is not diaphoretic.  Psychiatric: She has a normal mood and affect. Her behavior is normal.     No results found for: "WBC", "HGB", "HCT", "PLT", "GLUCOSE", "CHOL", "TRIG", "HDL", "LDLDIRECT", "LDLCALC", "ALT", "AST", "NA", "K", "CL", "CREATININE", "BUN", "CO2", "TSH", "PSA", "INR", "GLUF", "HGBA1C",  "MICROALBUR"       Assessment & Plan:   Physical exam: Screening blood work  deferred - gets blood work through [facility] Exercise  walks/runs daily Weight  normal.  Substance abuse  none   Reviewed recommended immunizations. Deferred tdap today   Health Maintenance  Topic Date Due   DTaP/Tdap/Td (1 - Tdap) Never done   PAP SMEAR-Modifier  Never done   COLONOSCOPY (Pts 45-69yrs Insurance coverage will need to be confirmed)  Never done   MAMMOGRAM  Never done   Zoster Vaccines- Shingrix (1 of 2) Never done   COVID-19 Vaccine (3 - 2023-24 season) 05/20/2022   INFLUENZA VACCINE  04/20/2023   HPV VACCINES  Aged Out   Hepatitis C Screening  Discontinued   HIV Screening  Discontinued      Referred for colonoscopy. Will schedule mammo    See Problem List for Assessment and Plan of chronic medical problems.

## 2023-01-17 ENCOUNTER — Ambulatory Visit (INDEPENDENT_AMBULATORY_CARE_PROVIDER_SITE_OTHER): Payer: BC Managed Care – PPO | Admitting: Internal Medicine

## 2023-01-17 ENCOUNTER — Ambulatory Visit (INDEPENDENT_AMBULATORY_CARE_PROVIDER_SITE_OTHER): Payer: BC Managed Care – PPO | Admitting: Family Medicine

## 2023-01-17 ENCOUNTER — Encounter: Payer: Self-pay | Admitting: Family Medicine

## 2023-01-17 VITALS — BP 120/74 | HR 84 | Ht 64.0 in | Wt 137.0 lb

## 2023-01-17 VITALS — BP 114/72 | HR 63 | Temp 98.3°F | Ht 64.0 in | Wt 137.0 lb

## 2023-01-17 DIAGNOSIS — G43511 Persistent migraine aura without cerebral infarction, intractable, with status migrainosus: Secondary | ICD-10-CM | POA: Diagnosis not present

## 2023-01-17 DIAGNOSIS — G4486 Cervicogenic headache: Secondary | ICD-10-CM

## 2023-01-17 DIAGNOSIS — M9901 Segmental and somatic dysfunction of cervical region: Secondary | ICD-10-CM | POA: Diagnosis not present

## 2023-01-17 DIAGNOSIS — M9903 Segmental and somatic dysfunction of lumbar region: Secondary | ICD-10-CM | POA: Diagnosis not present

## 2023-01-17 DIAGNOSIS — M9908 Segmental and somatic dysfunction of rib cage: Secondary | ICD-10-CM

## 2023-01-17 DIAGNOSIS — L905 Scar conditions and fibrosis of skin: Secondary | ICD-10-CM | POA: Diagnosis not present

## 2023-01-17 DIAGNOSIS — Z1211 Encounter for screening for malignant neoplasm of colon: Secondary | ICD-10-CM | POA: Diagnosis not present

## 2023-01-17 DIAGNOSIS — M9902 Segmental and somatic dysfunction of thoracic region: Secondary | ICD-10-CM

## 2023-01-17 DIAGNOSIS — Z0000 Encounter for general adult medical examination without abnormal findings: Secondary | ICD-10-CM

## 2023-01-17 DIAGNOSIS — M9904 Segmental and somatic dysfunction of sacral region: Secondary | ICD-10-CM | POA: Diagnosis not present

## 2023-01-17 NOTE — Assessment & Plan Note (Signed)
Chronic Occasional migraines Continue Ubrelvy 50 mg as needed-typically works well, on rare occasion needs to stay for second pill Refilled today

## 2023-01-17 NOTE — Assessment & Plan Note (Signed)
Cervicogenic headaches, no radicular symptoms at this moment.  Will monitor for any type of migraines.  With change of seasons patient has had difficulties with this previously.  Discussed home exercises and icing regimen.  Increase activity slowly.  Follow-up again in 6 to 8 weeks

## 2023-01-17 NOTE — Patient Instructions (Signed)
Good to see you! Sounds like you're going to have a great summer See you again in 6-8 weeks

## 2023-01-23 DIAGNOSIS — R519 Headache, unspecified: Secondary | ICD-10-CM | POA: Diagnosis not present

## 2023-01-23 DIAGNOSIS — R5381 Other malaise: Secondary | ICD-10-CM | POA: Diagnosis not present

## 2023-01-23 DIAGNOSIS — M542 Cervicalgia: Secondary | ICD-10-CM | POA: Diagnosis not present

## 2023-01-23 DIAGNOSIS — E039 Hypothyroidism, unspecified: Secondary | ICD-10-CM | POA: Diagnosis not present

## 2023-02-15 DIAGNOSIS — H5203 Hypermetropia, bilateral: Secondary | ICD-10-CM | POA: Diagnosis not present

## 2023-03-01 ENCOUNTER — Telehealth: Payer: Self-pay

## 2023-03-01 NOTE — Telephone Encounter (Signed)
Bernita Raisin approved. Effective dates: 02/27/23-02/26/24.  Approval letter in media

## 2023-03-01 NOTE — Telephone Encounter (Signed)
Patient notified via mychart

## 2023-03-06 NOTE — Progress Notes (Signed)
  [name] [address] [place] [address] Phone: [phone] Subjective:   Bruce Donath, am serving as a scribe for Dr. Antoine Primas.  I'm seeing this patient by the request  of:  Pincus Sanes, MD  CC: Headache, neck pain follow-up  [id number]  Madison [name] is a 52 y.o. female coming in with complaint of back and neck pain. OMT on 01/17/2023. Patient states that she is having L scapular pain that radiates into the neck. Has been on computer quite bit. L wrist pain over distal radius. Putting pressure ont this area relieves her pain.  Medications patient has been prescribed: Mobic  Taking:         Reviewed prior external information including notes and imaging from previsou exam, outside providers and external EMR if available.   As well as notes that were available from care everywhere and other healthcare systems.  Past medical history, social, surgical and family history all reviewed in electronic medical record.  No pertanent information unless stated regarding to the chief complaint.   Past Medical History:  Diagnosis Date   Migraine     Allergies  Allergen Reactions   Lactose Intolerance (Gi)     Gives her migraines     Review of Systems:  No , visual changes, nausea, vomiting, diarrhea, constipation, dizziness, abdominal pain, skin rash, fevers, chills, night sweats, weight loss, swollen lymph nodes, body aches, joint swelling, chest pain, shortness of breath, mood changes. POSITIVE muscle aches, headache  Objective  Blood pressure 118/76, pulse 70, height 5\' 4"  (1.626 m), weight 134 lb (60.8 kg), SpO2 98 %.   General: No apparent distress alert and oriented x3 mood and affect normal, dressed appropriately.  HEENT: Pupils equal, extraocular movements intact  Respiratory: Patient's speak in full sentences and does not appear short of breath  Cardiovascular: No lower extremity edema, non tender, no erythema   MSK:  Neck exam shows tightness noted in the left side of the neck noted.  Does have pain in the scapular area.  Osteopathic findings  C2 flexed rotated and side bent right C7 flexed rotated and side bent left T3 extended rotated and side bent left inhaled rib T7 extended rotated and side bent left inhaled rib L2 flexed rotated and side bent right Sacrum right on right     Assessment and Plan:  Cervicogenic headache Did have significant tightness noted more in the left parascapular area that was likely contributing.  Discussed icing regimen and home exercises.  Likely not having any type of true migraine at the moment.  Discussed ergonomics and follow-up again in 6 to 8 weeks    Nonallopathic problems  Decision today to treat with OMT was based on Physical Exam  After verbal consent patient was treated with HVLA, ME, FPR techniques in cervical, rib, thoracic, lumbar, and sacral  areas  Patient tolerated the procedure well with improvement in symptoms  Patient given exercises, stretches and lifestyle modifications  See medications in patient instructions if given  Patient will follow up in 4-8 weeks     The above documentation has been reviewed and is accurate and complete Judi Saa, DO         Note: This dictation was prepared with Dragon dictation along with smaller phrase technology. Any transcriptional errors that result from this process are unintentional.

## 2023-03-07 ENCOUNTER — Encounter: Payer: Self-pay | Admitting: Family Medicine

## 2023-03-07 ENCOUNTER — Ambulatory Visit (INDEPENDENT_AMBULATORY_CARE_PROVIDER_SITE_OTHER): Payer: BC Managed Care – PPO | Admitting: Family Medicine

## 2023-03-07 VITALS — BP 118/76 | HR 70 | Ht 64.0 in | Wt 134.0 lb

## 2023-03-07 DIAGNOSIS — G4486 Cervicogenic headache: Secondary | ICD-10-CM | POA: Diagnosis not present

## 2023-03-07 DIAGNOSIS — M9902 Segmental and somatic dysfunction of thoracic region: Secondary | ICD-10-CM | POA: Diagnosis not present

## 2023-03-07 DIAGNOSIS — M9901 Segmental and somatic dysfunction of cervical region: Secondary | ICD-10-CM | POA: Diagnosis not present

## 2023-03-07 DIAGNOSIS — M9904 Segmental and somatic dysfunction of sacral region: Secondary | ICD-10-CM

## 2023-03-07 DIAGNOSIS — M9908 Segmental and somatic dysfunction of rib cage: Secondary | ICD-10-CM | POA: Diagnosis not present

## 2023-03-07 DIAGNOSIS — M9903 Segmental and somatic dysfunction of lumbar region: Secondary | ICD-10-CM | POA: Diagnosis not present

## 2023-03-07 NOTE — Patient Instructions (Signed)
Good to see you Madison [name] for wrist when using CAD See me again in 6-8 weeks

## 2023-03-07 NOTE — Assessment & Plan Note (Signed)
Did have significant tightness noted more in the left parascapular area that was likely contributing.  Discussed icing regimen and home exercises.  Likely not having any type of true migraine at the moment.  Discussed ergonomics and follow-up again in 6 to 8 weeks

## 2023-04-18 ENCOUNTER — Ambulatory Visit: Payer: BC Managed Care – PPO | Admitting: Family Medicine

## 2023-06-08 NOTE — Progress Notes (Unsigned)
  [name] [address] [place] [address] Phone: [phone] Subjective:   INadine Counts, am serving as a scribe for Dr. Antoine Primas.  I'm seeing this patient by the request  of:  Pincus Sanes, MD  CC: Neck and back pain follow-up  [id number]  Laura-Lee Peele-Page is a 52 y.o. female coming in with complaint of back and neck pain. OMT on 03/07/2023. Patient states doing well. Has been moving a bunch for furniture market. Wants you to take a look at left wrist.  Medications patient has been prescribed:   Taking:         Reviewed prior external information including notes and imaging from previsou exam, outside providers and external EMR if available.   As well as notes that were available from care everywhere and other healthcare systems.  Past medical history, social, surgical and family history all reviewed in electronic medical record.  No pertanent information unless stated regarding to the chief complaint.   Past Medical History:  Diagnosis Date   Migraine     Allergies  Allergen Reactions   Lactose Intolerance (Gi)     Gives her migraines     Review of Systems:  No headache, visual changes, nausea, vomiting, diarrhea, constipation, dizziness, abdominal pain, skin rash, fevers, chills, night sweats, weight loss, swollen lymph nodes, body aches, joint swelling, chest pain, shortness of breath, mood changes. POSITIVE muscle aches  Objective  Blood pressure 118/74, pulse 82, height 5\' 4"  (1.626 m), weight 138 lb (62.6 kg), SpO2 96%.   General: No apparent distress alert and oriented x3 mood and affect normal, dressed appropriately.  HEENT: Pupils equal, extraocular movements intact  Respiratory: Patient's speak in full sentences and does not appear short of breath  Cardiovascular: No lower extremity edema, non tender, no erythema    Osteopathic findings  C2 flexed rotated and side bent right C6 flexed rotated and  side bent left T3 extended rotated and side bent right inhaled rib T9 extended rotated and side bent left L2 flexed rotated and side bent right Sacrum right on right       Assessment and Plan:  Cervicogenic headache Some increase in stress recently.  Did respond well though to osteopathic manipulation.  Hopeful that this will make significant improvement.  Discussed icing regimen and home exercises.  Discussed which activities to do and which ones to avoid.  No change in medications at the moment.  Follow-up again in 6 to 8 weeks otherwise.    Nonallopathic problems  Decision today to treat with OMT was based on Physical Exam  After verbal consent patient was treated with HVLA, ME, FPR techniques in cervical, rib, thoracic, lumbar, and sacral  areas  Patient tolerated the procedure well with improvement in symptoms  Patient given exercises, stretches and lifestyle modifications  See medications in patient instructions if given  Patient will follow up in 4-8 weeks             Note: This dictation was prepared with Dragon dictation along with smaller phrase technology. Any transcriptional errors that result from this process are unintentional.

## 2023-06-12 ENCOUNTER — Ambulatory Visit (INDEPENDENT_AMBULATORY_CARE_PROVIDER_SITE_OTHER): Payer: BC Managed Care – PPO | Admitting: Family Medicine

## 2023-06-12 VITALS — BP 118/74 | HR 82 | Ht 64.0 in | Wt 138.0 lb

## 2023-06-12 DIAGNOSIS — M9903 Segmental and somatic dysfunction of lumbar region: Secondary | ICD-10-CM | POA: Diagnosis not present

## 2023-06-12 DIAGNOSIS — M9904 Segmental and somatic dysfunction of sacral region: Secondary | ICD-10-CM

## 2023-06-12 DIAGNOSIS — G4486 Cervicogenic headache: Secondary | ICD-10-CM

## 2023-06-12 DIAGNOSIS — M9908 Segmental and somatic dysfunction of rib cage: Secondary | ICD-10-CM | POA: Diagnosis not present

## 2023-06-12 DIAGNOSIS — M9902 Segmental and somatic dysfunction of thoracic region: Secondary | ICD-10-CM

## 2023-06-12 DIAGNOSIS — M9901 Segmental and somatic dysfunction of cervical region: Secondary | ICD-10-CM

## 2023-06-12 NOTE — Assessment & Plan Note (Signed)
Some increase in stress recently.  Did respond well though to osteopathic manipulation.  Hopeful that this will make significant improvement.  Discussed icing regimen and home exercises.  Discussed which activities to do and which ones to avoid.  No change in medications at the moment.  Follow-up again in 6 to 8 weeks otherwise.

## 2023-06-12 NOTE — Patient Instructions (Signed)
Thumb spica splint to wear at night See you again in 4-5 weeks

## 2023-06-13 ENCOUNTER — Encounter: Payer: Self-pay | Admitting: Family Medicine

## 2023-06-14 ENCOUNTER — Other Ambulatory Visit: Payer: Self-pay | Admitting: Family Medicine

## 2023-07-02 ENCOUNTER — Other Ambulatory Visit: Payer: Self-pay | Admitting: Family Medicine

## 2023-07-13 NOTE — Progress Notes (Deleted)
  [name] [address] [place] [address] Phone: [phone] Subjective:    I'm seeing this patient by the request  of:  Burns, Bobette Mo, MD  CC:   [id number]  Madison [name] is a 52 y.o. female coming in with complaint of back and neck pain. OMT on 06/12/2023. Patient states   Medications patient has been prescribed: Mobic baclofen  Taking:         Reviewed prior external information including notes and imaging from previsou exam, outside providers and external EMR if available.   As well as notes that were available from care everywhere and other healthcare systems.  Past medical history, social, surgical and family history all reviewed in electronic medical record.  No pertanent information unless stated regarding to the chief complaint.   Past Medical History:  Diagnosis Date   Migraine     Allergies  Allergen Reactions   Lactose Intolerance (Gi)     Gives her migraines     Review of Systems:  No headache, visual changes, nausea, vomiting, diarrhea, constipation, dizziness, abdominal pain, skin rash, fevers, chills, night sweats, weight loss, swollen lymph nodes, body aches, joint swelling, chest pain, shortness of breath, mood changes. POSITIVE muscle aches  Objective  There were no vitals taken for this visit.   General: No apparent distress alert and oriented x3 mood and affect normal, dressed appropriately.  HEENT: Pupils equal, extraocular movements intact  Respiratory: Patient's speak in full sentences and does not appear short of breath  Cardiovascular: No lower extremity edema, non tender, no erythema  Gait MSK:  Back   Osteopathic findings  C2 flexed rotated and side bent right C6 flexed rotated and side bent left T3 extended rotated and side bent right inhaled rib T9 extended rotated and side bent left L2 flexed rotated and side bent right Sacrum right on right       Assessment and  Plan:  No problem-specific Assessment & Plan notes found for this encounter.    Nonallopathic problems  Decision today to treat with OMT was based on Physical Exam  After verbal consent patient was treated with HVLA, ME, FPR techniques in cervical, rib, thoracic, lumbar, and sacral  areas  Patient tolerated the procedure well with improvement in symptoms  Patient given exercises, stretches and lifestyle modifications  See medications in patient instructions if given  Patient will follow up in 4-8 weeks             Note: This dictation was prepared with Dragon dictation along with smaller phrase technology. Any transcriptional errors that result from this process are unintentional.

## 2023-07-19 ENCOUNTER — Ambulatory Visit: Payer: BC Managed Care – PPO | Admitting: Family Medicine

## 2023-07-20 NOTE — Progress Notes (Signed)
  [name] [address] [place] [address] Phone: [phone] Subjective:   INadine Counts, am serving as a scribe for Dr. Antoine Primas.  I'm seeing this patient by the request  of:  Pincus Sanes, MD  CC: back and neck pain follow up   [id number]  Madison [name] is a 52 y.o. female coming in with complaint of back and neck pain. OMT 06/12/2023. Patient states same per usual. Feels like she wakes up everyday with some discomfort in the neck area. No new concerns.  Medications patient has been prescribed: Meloxicam, Baclofen  Taking:         Reviewed prior external information including notes and imaging from previsou exam, outside providers and external EMR if available.   As well as notes that were available from care everywhere and other healthcare systems.  Past medical history, social, surgical and family history all reviewed in electronic medical record.  No pertanent information unless stated regarding to the chief complaint.   Past Medical History:  Diagnosis Date   Migraine     Allergies  Allergen Reactions   Lactose Intolerance (Gi)     Gives her migraines     Review of Systems:  No headache, visual changes, nausea, vomiting, diarrhea, constipation, dizziness, abdominal pain, skin rash, fevers, chills, night sweats, weight loss, swollen lymph nodes, body aches, joint swelling, chest pain, shortness of breath, mood changes. POSITIVE muscle aches  Objective  Blood pressure 126/86, pulse 75, height 5\' 4"  (1.626 m), weight 137 lb (62.1 kg), SpO2 98%.   General: No apparent distress alert and oriented x3 mood and affect normal, dressed appropriately.  HEENT: Pupils equal, extraocular movements intact  Respiratory: Patient's speak in full sentences and does not appear short of breath  Cardiovascular: No lower extremity edema, non tender, no erythema  MSK:  Back does have some loss of lordosis most of it seems to be  all in the unfortunate right side of the neck.  Seems to be more tightness with sidebending.  Does have some tightness noted with left-sided rotation of the left side of the neck.  Negative radicular symptoms on progression today.   Osteopathic findings  C2 flexed rotated and side bent right C6 flexed rotated and side bent right T3 extended rotated and side bent right inhaled rib T9 extended rotated and side bent right     Assessment and Plan:  No problem-specific Assessment & Plan notes found for this encounter.    Nonallopathic problems  Decision today to treat with OMT was based on Physical Exam  After verbal consent patient was treated with HVLA, ME, FPR techniques in cervical, rib, thoracic, areas  Patient tolerated the procedure well with improvement in symptoms  Patient given exercises, stretches and lifestyle modifications  See medications in patient instructions if given  Patient will follow up in 6-8 weeks     The above documentation has been reviewed and is accurate and complete Judi Saa, DO         Note: This dictation was prepared with Dragon dictation along with smaller phrase technology. Any transcriptional errors that result from this process are unintentional.

## 2023-07-25 ENCOUNTER — Encounter: Payer: Self-pay | Admitting: Family Medicine

## 2023-07-25 ENCOUNTER — Ambulatory Visit: Payer: BC Managed Care – PPO | Admitting: Family Medicine

## 2023-07-25 ENCOUNTER — Ambulatory Visit: Payer: BC Managed Care – PPO

## 2023-07-25 VITALS — BP 126/86 | HR 75 | Ht 64.0 in | Wt 137.0 lb

## 2023-07-25 DIAGNOSIS — M5412 Radiculopathy, cervical region: Secondary | ICD-10-CM

## 2023-07-25 DIAGNOSIS — M9902 Segmental and somatic dysfunction of thoracic region: Secondary | ICD-10-CM

## 2023-07-25 DIAGNOSIS — M9901 Segmental and somatic dysfunction of cervical region: Secondary | ICD-10-CM | POA: Diagnosis not present

## 2023-07-25 DIAGNOSIS — M9908 Segmental and somatic dysfunction of rib cage: Secondary | ICD-10-CM | POA: Diagnosis not present

## 2023-07-25 NOTE — Assessment & Plan Note (Signed)
Cervical radiculopathy.  Will continue to monitor.  Will get repeat x-rays of the neck that I think will be more beneficial again to see if there is any been any progression.  Discussed which activities to do and which ones to avoid.  Increase activity slowly.  Increase activities.  Follow-up again in 6 to 8 weeks.

## 2023-07-25 NOTE — Patient Instructions (Signed)
See you again in 6-8 weeks 

## 2023-07-31 ENCOUNTER — Other Ambulatory Visit: Payer: Self-pay | Admitting: Family Medicine

## 2023-09-01 ENCOUNTER — Other Ambulatory Visit: Payer: Self-pay | Admitting: Family Medicine

## 2023-09-05 ENCOUNTER — Ambulatory Visit: Payer: BC Managed Care – PPO | Admitting: Family Medicine

## 2023-09-06 NOTE — Progress Notes (Unsigned)
  [name] [address] [place] [address] Phone: [phone] Subjective:   INadine Counts, am serving as a scribe for Dr. Antoine Primas.  I'm seeing this patient by the request  of:  Pincus Sanes, MD  CC: Headaches and neck pain  [id number]  Jammi Peele-Page is a 52 y.o. female coming in with complaint of back and neck pain.  Usually for cervicogenic headaches as well.  OMT on 07/25/2023. Patient states same per usual. Left side bothersome. No new concerns.  Medications patient has been prescribed: baclofen  Taking:         Reviewed prior external information including notes and imaging from previsou exam, outside providers and external EMR if available.   As well as notes that were available from care everywhere and other healthcare systems.  Past medical history, social, surgical and family history all reviewed in electronic medical record.  No pertanent information unless stated regarding to the chief complaint.   Past Medical History:  Diagnosis Date   Migraine     Allergies  Allergen Reactions   Lactose Intolerance (Gi)     Gives her migraines     Review of Systems:  No headache, visual changes, nausea, vomiting, diarrhea, constipation, dizziness, abdominal pain, skin rash, fevers, chills, night sweats, weight loss, swollen lymph nodes, body aches, joint swelling, chest pain, shortness of breath, mood changes. POSITIVE muscle aches  Objective  Blood pressure 104/74, pulse 85, height 5\' 4"  (1.626 m), weight 138 lb (62.6 kg), SpO2 98%.   General: No apparent distress alert and oriented x3 mood and affect normal, dressed appropriately.  HEENT: Pupils equal, extraocular movements intact  Respiratory: Patient's speak in full sentences and does not appear short of breath  Cardiovascular: No lower extremity edema, non tender, no erythema  Significant tightness in the neck noted, seems to be more on the left shoulder  blade.  Mostly in the medial aspect.  Osteopathic findings  C3 flexed rotated and side bent right C6 flexed rotated and side bent left T5 extended rotated and side bent left inhaled rib T9 extended rotated and side bent left inhaled rib L1 flexed rotated and side bent right L3 flexed rotated and side bent left Sacrum right on right       Assessment and Plan:  Cervicogenic headache Significant tightness noted on the left side of the shoulder blade.  We discussed with patient about ergonomics and may be changing so she has not been consented to her left on a regular basis.  Discussed icing regimen and home exercises.  Increase activity slowly otherwise.  Follow-up again in 6 to 8 weeks.    Nonallopathic problems  Decision today to treat with OMT was based on Physical Exam  After verbal consent patient was treated with HVLA, ME, FPR techniques in cervical, rib, thoracic, lumbar, and sacral  areas  Patient tolerated the procedure well with improvement in symptoms  Patient given exercises, stretches and lifestyle modifications  See medications in patient instructions if given  Patient will follow up in 4-8 weeks     The above documentation has been reviewed and is accurate and complete Judi Saa, DO         Note: This dictation was prepared with Dragon dictation along with smaller phrase technology. Any transcriptional errors that result from this process are unintentional.

## 2023-09-07 ENCOUNTER — Encounter: Payer: Self-pay | Admitting: Family Medicine

## 2023-09-07 ENCOUNTER — Ambulatory Visit: Payer: BC Managed Care – PPO | Admitting: Family Medicine

## 2023-09-07 VITALS — BP 104/74 | HR 85 | Ht 64.0 in | Wt 138.0 lb

## 2023-09-07 DIAGNOSIS — G4486 Cervicogenic headache: Secondary | ICD-10-CM | POA: Diagnosis not present

## 2023-09-07 DIAGNOSIS — M9901 Segmental and somatic dysfunction of cervical region: Secondary | ICD-10-CM | POA: Diagnosis not present

## 2023-09-07 DIAGNOSIS — M9904 Segmental and somatic dysfunction of sacral region: Secondary | ICD-10-CM

## 2023-09-07 DIAGNOSIS — M9903 Segmental and somatic dysfunction of lumbar region: Secondary | ICD-10-CM

## 2023-09-07 DIAGNOSIS — M9908 Segmental and somatic dysfunction of rib cage: Secondary | ICD-10-CM | POA: Diagnosis not present

## 2023-09-07 DIAGNOSIS — M9902 Segmental and somatic dysfunction of thoracic region: Secondary | ICD-10-CM

## 2023-09-07 NOTE — Patient Instructions (Signed)
Good to see you Fingers crossed Christmas miracle See me again in 2 months

## 2023-09-07 NOTE — Assessment & Plan Note (Signed)
Significant tightness noted on the left side of the shoulder blade.  We discussed with patient about ergonomics and may be changing so she has not been consented to her left on a regular basis.  Discussed icing regimen and home exercises.  Increase activity slowly otherwise.  Follow-up again in 6 to 8 weeks.

## 2023-10-16 ENCOUNTER — Ambulatory Visit: Payer: BC Managed Care – PPO | Admitting: Family Medicine

## 2023-10-26 ENCOUNTER — Other Ambulatory Visit: Payer: Self-pay | Admitting: Internal Medicine

## 2023-10-26 DIAGNOSIS — Z1231 Encounter for screening mammogram for malignant neoplasm of breast: Secondary | ICD-10-CM

## 2023-10-31 NOTE — Progress Notes (Deleted)
  [name] [address] [place] [address] Phone: [phone] Subjective:    I'm seeing this patient by the request  of:  Burns, Bobette Mo, MD  CC:   [id number]  Madison [name] is a 53 y.o. female coming in with complaint of back and neck pain. OMT 09/07/2023. Patient states   Medications patient has been prescribed: Baclofen  Taking:         Reviewed prior external information including notes and imaging from previsou exam, outside providers and external EMR if available.   As well as notes that were available from care everywhere and other healthcare systems.  Past medical history, social, surgical and family history all reviewed in electronic medical record.  No pertanent information unless stated regarding to the chief complaint.   Past Medical History:  Diagnosis Date   Migraine     Allergies  Allergen Reactions   Lactose Intolerance (Gi)     Gives her migraines     Review of Systems:  No headache, visual changes, nausea, vomiting, diarrhea, constipation, dizziness, abdominal pain, skin rash, fevers, chills, night sweats, weight loss, swollen lymph nodes, body aches, joint swelling, chest pain, shortness of breath, mood changes. POSITIVE muscle aches  Objective  There were no vitals taken for this visit.   General: No apparent distress alert and oriented x3 mood and affect normal, dressed appropriately.  HEENT: Pupils equal, extraocular movements intact  Respiratory: Patient's speak in full sentences and does not appear short of breath  Cardiovascular: No lower extremity edema, non tender, no erythema  Gait MSK:  Back   Osteopathic findings  C2 flexed rotated and side bent right C6 flexed rotated and side bent left T3 extended rotated and side bent right inhaled rib T9 extended rotated and side bent left L2 flexed rotated and side bent right Sacrum right on right       Assessment and Plan:  No  problem-specific Assessment & Plan notes found for this encounter.    Nonallopathic problems  Decision today to treat with OMT was based on Physical Exam  After verbal consent patient was treated with HVLA, ME, FPR techniques in cervical, rib, thoracic, lumbar, and sacral  areas  Patient tolerated the procedure well with improvement in symptoms  Patient given exercises, stretches and lifestyle modifications  See medications in patient instructions if given  Patient will follow up in 4-8 weeks             Note: This dictation was prepared with Dragon dictation along with smaller phrase technology. Any transcriptional errors that result from this process are unintentional.

## 2023-11-09 ENCOUNTER — Ambulatory Visit: Payer: BC Managed Care – PPO | Admitting: Family Medicine

## 2023-11-15 ENCOUNTER — Ambulatory Visit: Payer: BC Managed Care – PPO

## 2023-11-15 DIAGNOSIS — Z1231 Encounter for screening mammogram for malignant neoplasm of breast: Secondary | ICD-10-CM

## 2023-11-15 NOTE — Progress Notes (Deleted)
  [name] [address] [place] [address] Phone: [phone] Subjective:    I'm seeing this patient by the request  of:  Burns, Bobette Mo, MD  CC:   [id number]  Madison [name] is a 53 y.o. female coming in with complaint of back and neck pain. OMT 09/07/2023. Patient states   Medications patient has been prescribed: Baclofen  Taking:         Reviewed prior external information including notes and imaging from previsou exam, outside providers and external EMR if available.   As well as notes that were available from care everywhere and other healthcare systems.  Past medical history, social, surgical and family history all reviewed in electronic medical record.  No pertanent information unless stated regarding to the chief complaint.   Past Medical History:  Diagnosis Date   Migraine     Allergies  Allergen Reactions   Lactose Intolerance (Gi)     Gives her migraines     Review of Systems:  No headache, visual changes, nausea, vomiting, diarrhea, constipation, dizziness, abdominal pain, skin rash, fevers, chills, night sweats, weight loss, swollen lymph nodes, body aches, joint swelling, chest pain, shortness of breath, mood changes. POSITIVE muscle aches  Objective  Last menstrual period 07/05/2023.   General: No apparent distress alert and oriented x3 mood and affect normal, dressed appropriately.  HEENT: Pupils equal, extraocular movements intact  Respiratory: Patient's speak in full sentences and does not appear short of breath  Cardiovascular: No lower extremity edema, non tender, no erythema  Gait MSK:  Back   Osteopathic findings  C2 flexed rotated and side bent right C6 flexed rotated and side bent left T3 extended rotated and side bent right inhaled rib T9 extended rotated and side bent left L2 flexed rotated and side bent right Sacrum right on right       Assessment and Plan:  No  problem-specific Assessment & Plan notes found for this encounter.    Nonallopathic problems  Decision today to treat with OMT was based on Physical Exam  After verbal consent patient was treated with HVLA, ME, FPR techniques in cervical, rib, thoracic, lumbar, and sacral  areas  Patient tolerated the procedure well with improvement in symptoms  Patient given exercises, stretches and lifestyle modifications  See medications in patient instructions if given  Patient will follow up in 4-8 weeks             Note: This dictation was prepared with Dragon dictation along with smaller phrase technology. Any transcriptional errors that result from this process are unintentional.

## 2023-11-20 ENCOUNTER — Telehealth: Payer: Self-pay | Admitting: Internal Medicine

## 2023-11-20 DIAGNOSIS — R928 Other abnormal and inconclusive findings on diagnostic imaging of breast: Secondary | ICD-10-CM

## 2023-11-20 NOTE — Telephone Encounter (Signed)
 Please call her-she had mammogram and needs additional imaging.  I ordered a diagnostic mammogram for the breast center.  They should call her to schedule and if she does not hear from them she needs to let us know.   Mary-FYI stat mammo and ultrasound of breasts ordered.  Breast center.

## 2023-11-21 ENCOUNTER — Ambulatory Visit: Payer: BC Managed Care – PPO | Admitting: Family Medicine

## 2023-11-23 ENCOUNTER — Ambulatory Visit

## 2023-11-23 ENCOUNTER — Ambulatory Visit
Admission: RE | Admit: 2023-11-23 | Discharge: 2023-11-23 | Disposition: A | Discharge status: Home / Self Care | Source: Ambulatory Visit | Attending: Internal Medicine | Admitting: Internal Medicine

## 2023-11-23 ENCOUNTER — Other Ambulatory Visit: Payer: Self-pay | Admitting: Internal Medicine

## 2023-11-23 DIAGNOSIS — N6489 Other specified disorders of breast: Secondary | ICD-10-CM

## 2023-11-23 DIAGNOSIS — R928 Other abnormal and inconclusive findings on diagnostic imaging of breast: Secondary | ICD-10-CM

## 2023-11-27 NOTE — Telephone Encounter (Signed)
 Patient has since had both of these on 11/23/23.

## 2023-12-14 NOTE — Progress Notes (Signed)
 [name] [address] [place] [address] Phone: [phone] Subjective:    I'm seeing this patient by the request  of:  Pincus Sanes, MD  CC: Back pain and neck pain follow-up  [id number]  Madison [name] is a 53 y.o. female coming in with complaint of back and neck pain. OMT 09/07/2023. Patient states  Has been doing relatively well but been doing pretty marked.  Because of that has been doing more hours and more of a seated position and did do a time where she did pull a little carpet noted.  Feels that that could have contributed to some of the discomfort. Medications patient has been prescribed: Baclofen  Taking: Yes         Reviewed prior external information including notes and imaging from previsou exam, outside providers and external EMR if available.   As well as notes that were available from care everywhere and other healthcare systems.  Past medical history, social, surgical and family history all reviewed in electronic medical record.  No pertanent information unless stated regarding to the chief complaint.   Past Medical History:  Diagnosis Date   Migraine     Allergies  Allergen Reactions   Lactose Intolerance (Gi)     Gives her migraines     Review of Systems:  No headache, visual changes, nausea, vomiting, diarrhea, constipation, dizziness, abdominal pain, skin rash, fevers, chills, night sweats, weight loss, swollen lymph nodes, body aches, joint swelling, chest pain, shortness of breath, mood changes. POSITIVE muscle aches  Objective  Blood pressure 118/82, pulse 76, height 5\' 4"  (1.626 m), weight 141 lb (64 kg), SpO2 98%.   General: No apparent distress alert and oriented x3 mood and affect normal, dressed appropriately.  HEENT: Pupils equal, extraocular movements intact  Respiratory: Patient's speak in full sentences and does not appear short of breath  Cardiovascular: No lower extremity edema, non  tender, no erythema  Gait relatively normal MSK:  Back does have some loss lordosis noted.  Patient does have significant tightness noted on the left side of the neck.  Osteopathic findings  C2 flexed rotated and side bent right C6 flexed rotated and side bent left T3 extended rotated and side bent left inhaled rib T9 extended rotated and side bent left L2 flexed rotated and side bent right Sacrum right on right       Assessment and Plan:  Cervicogenic headache Significant tightness noted.  We discussed icing regimen and home exercises, discussed which activities to do and which ones to avoid.  Discussed posture and ergonomics.  Has responded well to osteopathic manipulation previously.  Follow-up with me again in 6 to 8 weeks otherwise.  Will be doing furniture market in the next 4 weeks.  Cervical radiculopathy Tightness noted of the upper back.  Has had chronic difficulty with this previously.  Discussed icing regimen and home exercises, discussed which activities to do and which ones to avoid.  Increase activity slowly otherwise.  Discussed posture and ergonomics.  Follow-up again in 5 to 6 weeks or after furniture market    Nonallopathic problems  Decision today to treat with OMT was based on Physical Exam  After verbal consent patient was treated with HVLA, ME, FPR techniques in cervical, rib, thoracic, lumbar, and sacral  areas  Patient tolerated the procedure well with improvement in symptoms  Patient given exercises, stretches and lifestyle modifications  See medications in patient instructions if given  Patient will follow up  in 4-8 weeks     The above documentation has been reviewed and is accurate and complete Madison Saa, DO         Note: This dictation was prepared with Dragon dictation along with smaller phrase technology. Any transcriptional errors that result from this process are unintentional.

## 2023-12-19 ENCOUNTER — Ambulatory Visit (INDEPENDENT_AMBULATORY_CARE_PROVIDER_SITE_OTHER): Admitting: Family Medicine

## 2023-12-19 ENCOUNTER — Encounter: Payer: Self-pay | Admitting: Family Medicine

## 2023-12-19 VITALS — BP 118/82 | HR 76 | Ht 64.0 in | Wt 141.0 lb

## 2023-12-19 DIAGNOSIS — M9902 Segmental and somatic dysfunction of thoracic region: Secondary | ICD-10-CM | POA: Diagnosis not present

## 2023-12-19 DIAGNOSIS — G4486 Cervicogenic headache: Secondary | ICD-10-CM | POA: Diagnosis not present

## 2023-12-19 DIAGNOSIS — M9901 Segmental and somatic dysfunction of cervical region: Secondary | ICD-10-CM

## 2023-12-19 DIAGNOSIS — G8929 Other chronic pain: Secondary | ICD-10-CM

## 2023-12-19 DIAGNOSIS — M9903 Segmental and somatic dysfunction of lumbar region: Secondary | ICD-10-CM | POA: Diagnosis not present

## 2023-12-19 DIAGNOSIS — M9908 Segmental and somatic dysfunction of rib cage: Secondary | ICD-10-CM

## 2023-12-19 DIAGNOSIS — M5412 Radiculopathy, cervical region: Secondary | ICD-10-CM

## 2023-12-19 DIAGNOSIS — M9904 Segmental and somatic dysfunction of sacral region: Secondary | ICD-10-CM | POA: Diagnosis not present

## 2023-12-19 DIAGNOSIS — M549 Dorsalgia, unspecified: Secondary | ICD-10-CM

## 2023-12-19 NOTE — Patient Instructions (Signed)
 Good to see you! Good luck with furniture market See you again in 5-6 weeks

## 2023-12-19 NOTE — Assessment & Plan Note (Signed)
 Zanaflex 4 mg to take at night prescribed

## 2023-12-19 NOTE — Assessment & Plan Note (Signed)
 Tightness noted of the upper back.  Has had chronic difficulty with this previously.  Discussed icing regimen and home exercises, discussed which activities to do and which ones to avoid.  Increase activity slowly otherwise.  Discussed posture and ergonomics.  Follow-up again in 5 to 6 weeks or after furniture market

## 2023-12-19 NOTE — Assessment & Plan Note (Signed)
 Significant tightness noted.  We discussed icing regimen and home exercises, discussed which activities to do and which ones to avoid.  Discussed posture and ergonomics.  Has responded well to osteopathic manipulation previously.  Follow-up with me again in 6 to 8 weeks otherwise.  Will be doing furniture market in the next 4 weeks.

## 2024-01-18 ENCOUNTER — Encounter: Payer: BC Managed Care – PPO | Admitting: Internal Medicine

## 2024-01-25 NOTE — Progress Notes (Unsigned)
 [name] [address] [place] [address] Phone: [phone] Subjective:   Madison [name], am serving as a scribe for Dr. Ronnell Coins.  I'm seeing this patient by the request  of:  Madison Dauphin, MD  CC: back and neck pain follow up   [id number]  Madison [name] is a 53 y.o. female coming in with complaint of back and neck pain. OMT on 12/19/2023. Patient states that she has a tight L scapula after furniture market. Also has tightness in the neck causing headaches. Pain in the L side of lumbar spine.           Reviewed prior external information including notes and imaging from previsou exam, outside providers and external EMR if available.   As well as notes that were available from care everywhere and other healthcare systems.  Past medical history, social, surgical and family history all reviewed in electronic medical record.  No pertanent information unless stated regarding to the chief complaint.   Past Medical History:  Diagnosis Date   Migraine     Allergies  Allergen Reactions   Lactose Intolerance (Gi)     Gives her migraines     Review of Systems:  No headache, visual changes, nausea, vomiting, diarrhea, constipation, dizziness, abdominal pain, skin rash, fevers, chills, night sweats, weight loss, swollen lymph nodes, body aches, joint swelling, chest pain, shortness of breath, mood changes. POSITIVE muscle aches  Objective  Blood pressure 122/82, height 5\' 4"  (1.626 m), weight 141 lb (64 kg).   General: No apparent distress alert and oriented x3 mood and affect normal, dressed appropriately.  HEENT: Pupils equal, extraocular movements intact  Respiratory: Patient's speak in full sentences and does not appear short of breath  Cardiovascular: No lower extremity edema, non tender, no erythema  Gait relatively normal MSK:  Back does have some loss of lordosis.  Neck exam does have some limited sidebending.   Still significant difficulty with tightness of the trapezius but today seems to be more left greater than right.  Does have some tightness with certain motions.  Osteopathic findings  C2 flexed rotated and side bent right C6 flexed rotated and side bent right T3 extended rotated and side bent right inhaled rib T6 extended rotated and side bent left L1 flexed rotated and side bent right Sacrum right on right     Assessment and Plan:  Cervicogenic headache Continue to work slowly.  Patient will continue with posture.  Patient is going to be likely in transitioning to a new role with work which I think will be highly beneficial for this individual.  Discussed icing regimen and home exercises otherwise, no change in medication but has muscle relaxers for breakthrough.  Could give Toradol  and Depo-Medrol  if needed for breakthrough pain.  Follow-up again in 6 to 8 weeks    Nonallopathic problems  Decision today to treat with OMT was based on Physical Exam  After verbal consent patient was treated with HVLA, ME, FPR techniques in cervical, rib, thoracic, lumbar, and sacral  areas  Patient tolerated the procedure well with improvement in symptoms  Patient given exercises, stretches and lifestyle modifications  See medications in patient instructions if given  Patient will follow up in 4-8 weeks    The above documentation has been reviewed and is accurate and complete Ted Goodner M Kelleen Stolze, DO          Note: This dictation was prepared with Dragon dictation along with smaller phrase technology.  Any transcriptional errors that result from this process are unintentional.

## 2024-01-30 ENCOUNTER — Ambulatory Visit: Admitting: Family Medicine

## 2024-01-30 VITALS — BP 122/82 | Ht 64.0 in | Wt 141.0 lb

## 2024-01-30 DIAGNOSIS — M9908 Segmental and somatic dysfunction of rib cage: Secondary | ICD-10-CM

## 2024-01-30 DIAGNOSIS — M9904 Segmental and somatic dysfunction of sacral region: Secondary | ICD-10-CM | POA: Diagnosis not present

## 2024-01-30 DIAGNOSIS — M9901 Segmental and somatic dysfunction of cervical region: Secondary | ICD-10-CM

## 2024-01-30 DIAGNOSIS — G4486 Cervicogenic headache: Secondary | ICD-10-CM

## 2024-01-30 DIAGNOSIS — M9902 Segmental and somatic dysfunction of thoracic region: Secondary | ICD-10-CM | POA: Diagnosis not present

## 2024-01-30 DIAGNOSIS — M9903 Segmental and somatic dysfunction of lumbar region: Secondary | ICD-10-CM | POA: Diagnosis not present

## 2024-01-30 NOTE — Patient Instructions (Signed)
See me again in 5-6 weeks 

## 2024-01-31 ENCOUNTER — Encounter: Payer: Self-pay | Admitting: Family Medicine

## 2024-01-31 NOTE — Assessment & Plan Note (Signed)
 Continue to work slowly.  Patient will continue with posture.  Patient is going to be likely in transitioning to a new role with work which I think will be highly beneficial for this individual.  Discussed icing regimen and home exercises otherwise, no change in medication but has muscle relaxers for breakthrough.  Could give Toradol  and Depo-Medrol  if needed for breakthrough pain.  Follow-up again in 6 to 8 weeks

## 2024-02-08 ENCOUNTER — Encounter: Payer: Self-pay | Admitting: Internal Medicine

## 2024-02-08 NOTE — Patient Instructions (Addendum)
 Blood work was ordered.       Medications changes include :   None    A referral was ordered for Gyn and GI for a colonoscopy and someone will call you to schedule an appointment.     Return in about 1 year (around 02/08/2025) for Physical Exam.    Health Maintenance, Female Adopting a healthy lifestyle and getting preventive care are important in promoting health and wellness. Ask your health care provider about: The right schedule for you to have regular tests and exams. Things you can do on your own to prevent diseases and keep yourself healthy. What should I know about diet, weight, and exercise? Eat a healthy diet  Eat a diet that includes plenty of vegetables, fruits, low-fat dairy products, and lean protein. Do not eat a lot of foods that are high in solid fats, added sugars, or sodium. Maintain a healthy weight Body mass index (BMI) is used to identify weight problems. It estimates body fat based on height and weight. Your health care provider can help determine your BMI and help you achieve or maintain a healthy weight. Get regular exercise Get regular exercise. This is one of the most important things you can do for your health. Most adults should: Exercise for at least 150 minutes each week. The exercise should increase your heart rate and make you sweat (moderate-intensity exercise). Do strengthening exercises at least twice a week. This is in addition to the moderate-intensity exercise. Spend less time sitting. Even light physical activity can be beneficial. Watch cholesterol and blood lipids Have your blood tested for lipids and cholesterol at 53 years of age, then have this test every 5 years. Have your cholesterol levels checked more often if: Your lipid or cholesterol levels are high. You are older than 53 years of age. You are at high risk for heart disease. What should I know about cancer screening? Depending on your health history and family  history, you may need to have cancer screening at various ages. This may include screening for: Breast cancer. Cervical cancer. Colorectal cancer. Skin cancer. Lung cancer. What should I know about heart disease, diabetes, and high blood pressure? Blood pressure and heart disease High blood pressure causes heart disease and increases the risk of stroke. This is more likely to develop in people who have high blood pressure readings or are overweight. Have your blood pressure checked: Every 3-5 years if you are 87-52 years of age. Every year if you are 21 years old or older. Diabetes Have regular diabetes screenings. This checks your fasting blood sugar level. Have the screening done: Once every three years after age 1 if you are at a normal weight and have a low risk for diabetes. More often and at a younger age if you are overweight or have a high risk for diabetes. What should I know about preventing infection? Hepatitis B If you have a higher risk for hepatitis B, you should be screened for this virus. Talk with your health care provider to find out if you are at risk for hepatitis B infection. Hepatitis C Testing is recommended for: Everyone born from [birth year] through 1965. Anyone with known risk factors for hepatitis C. Sexually transmitted infections (STIs) Get screened for STIs, including gonorrhea and chlamydia, if: You are sexually active and are younger than 53 years of age. You are older than 53 years of age and your health care provider tells you that you are at risk for  this type of infection. Your sexual activity has changed since you were last screened, and you are at increased risk for chlamydia or gonorrhea. Ask your health care provider if you are at risk. Ask your health care provider about whether you are at high risk for HIV. Your health care provider may recommend a prescription medicine to help prevent HIV infection. If you choose to take medicine to prevent HIV, you  should first get tested for HIV. You should then be tested every 3 months for as long as you are taking the medicine. Pregnancy If you are about to stop having your period (premenopausal) and you may become pregnant, seek counseling before you get pregnant. Take 400 to 800 micrograms (mcg) of folic acid  every day if you become pregnant. Ask for birth control (contraception) if you want to prevent pregnancy. Osteoporosis and menopause Osteoporosis is a disease in which the bones lose minerals and strength with aging. This can result in bone fractures. If you are 36 years old or older, or if you are at risk for osteoporosis and fractures, ask your health care provider if you should: Be screened for bone loss. Take a calcium or vitamin D supplement to lower your risk of fractures. Be given hormone replacement therapy (HRT) to treat symptoms of menopause. Follow these instructions at home: Alcohol use Do not drink alcohol if: Your health care provider tells you not to drink. You are pregnant, may be pregnant, or are planning to become pregnant. If you drink alcohol: Limit how much you have to: 0-1 drink a day. Know how much alcohol is in your drink. In the [place] one drink equals one 12 oz bottle of beer (355 mL), one 5 oz glass of wine (148 mL), or one 1 oz glass of hard liquor (44 mL). Lifestyle Do not use any products that contain nicotine or tobacco. These products include cigarettes, chewing tobacco, and vaping devices, such as e-cigarettes. If you need help quitting, ask your health care provider. Do not use street drugs. Do not share needles. Ask your health care provider for help if you need support or information about quitting drugs. General instructions Schedule regular health, dental, and eye exams. Stay current with your vaccines. Tell your health care provider if: You often feel depressed. You have ever been abused or do not feel safe at home. Summary Adopting a healthy  lifestyle and getting preventive care are important in promoting health and wellness. Follow your health care provider's instructions about healthy diet, exercising, and getting tested or screened for diseases. Follow your health care provider's instructions on monitoring your cholesterol and blood pressure. This information is not intended to replace advice given to you by your health care provider. Make sure you discuss any questions you have with your health care provider. Document Revised: 01/25/2021 Document Reviewed: 01/25/2021 Elsevier Patient Education  2024 [employer].

## 2024-02-08 NOTE — Progress Notes (Signed)
 Subjective:    Patient ID: Madison [name], female    DOB: 1971-08-08, 53 y.o.   MRN: [id number]      HPI Madison [name] is here for a Physical exam and her chronic medical problems.    Perimenopause  - on progesterone ,estrogen cream and testosterone  by [facility].    Medications and allergies reviewed with patient and updated if appropriate.  Current Outpatient Medications on File Prior to Visit  Medication Sig Dispense Refill   ARMOUR THYROID  30 MG tablet Take 30 mg by mouth daily.     ARMOUR THYROID  60 MG tablet Take by mouth.     baclofen  (LIORESAL ) 10 MG tablet TAKE 1 TABLET(10 MG) BY MOUTH THREE TIMES DAILY AS NEEDED FOR MUSCLE SPASMS 90 tablet 0   Biotin 5000 MCG CAPS Take by mouth.     Cholecalciferol (VITAMIN D3) 1000 units CAPS Take 1 capsule by mouth daily.     MAGNESIUM MALATE PO Take by mouth.     meloxicam  (MOBIC ) 15 MG tablet TAKE 1 TABLET(15 MG) BY MOUTH DAILY 90 tablet 0   Menaquinone-7 (VITAMIN K2) 100 MCG CAPS Take 1 capsule by mouth daily.     Multiple Vitamin (MULTIVITAMIN) tablet Take 1 tablet by mouth daily.     Probiotic Product (PROBIOTIC-10 PO) Take 1 capsule by mouth daily.     progesterone  (PROMETRIUM ) 100 MG capsule Take 100 mg by mouth at bedtime.     RESTASIS 0.05 % ophthalmic emulsion      Zinc  30 MG TABS Take by mouth. (Patient not taking: Reported on 02/09/2024)     No current facility-administered medications on file prior to visit.    Review of Systems  Constitutional:  Negative for fever.  Eyes:  Negative for visual disturbance.  Respiratory:  Negative for cough, shortness of breath and wheezing.   Cardiovascular:  Negative for chest pain, palpitations and leg swelling.  Gastrointestinal:  Negative for abdominal pain, blood in stool, constipation and diarrhea.       No gerd  Genitourinary:  Negative for dysuria.  Musculoskeletal:  Positive for arthralgias (mild knees) and neck pain (muscle pain in posterior neck and down to  shoulders). Negative for back pain.  Skin:  Negative for rash.  Neurological:  Positive for headaches (neck pain, hormones). Negative for dizziness and light-headedness.  Psychiatric/Behavioral:  Negative for dysphoric mood. The patient is not nervous/anxious.        Objective:   Vitals:   02/09/24 1323  BP: 118/82  Pulse: 80  Temp: 97.8 F (36.6 C)  SpO2: 99%   Filed Weights   02/09/24 1323  Weight: 140 lb 3.2 oz (63.6 kg)   Body mass index is 24.07 kg/m.  BP Readings from Last 3 Encounters:  02/09/24 118/82  01/30/24 122/82  12/19/23 118/82    Wt Readings from Last 3 Encounters:  02/09/24 140 lb 3.2 oz (63.6 kg)  01/30/24 141 lb (64 kg)  12/19/23 141 lb (64 kg)       Physical Exam Constitutional: She appears well-developed and well-nourished. No distress.  HENT:  Head: Normocephalic and atraumatic.  Right Ear: External ear normal. Normal ear canal and TM Left Ear: External ear normal.  Normal ear canal and TM Mouth/Throat: Oropharynx is clear and moist.  Eyes: Conjunctivae normal.  Neck: Neck supple. No tracheal deviation present. No thyromegaly present.  No carotid bruit  Cardiovascular: Normal rate, regular rhythm and normal heart sounds.   No murmur heard.  No edema. Pulmonary/Chest: Effort normal  and breath sounds normal. No respiratory distress. She has no wheezes. She has no rales.  Breast: deferred   Abdominal: Soft. She exhibits no distension. There is no tenderness.  Lymphadenopathy: She has no cervical adenopathy.  Skin: Skin is warm and dry. She is not diaphoretic.  Psychiatric: She has a normal mood and affect. Her behavior is normal.     No results found for: "WBC", "HGB", "HCT", "PLT", "GLUCOSE", "CHOL", "TRIG", "HDL", "LDLDIRECT", "LDLCALC", "ALT", "AST", "NA", "K", "CL", "CREATININE", "BUN", "CO2", "TSH", "PSA", "INR", "GLUF", "HGBA1C", "MICROALBUR"       Assessment & Plan:   Physical exam: Screening blood work  ordered Exercise   hot yoga, walking and weights Weight  normal Substance abuse  none   Reviewed recommended immunizations.   Health Maintenance  Topic Date Due   DTaP/Tdap/Td (1 - Tdap) Never done   Cervical Cancer Screening (HPV/Pap Cotest)  Never done   Colonoscopy  Never done   Zoster Vaccines- Shingrix (1 of 2) Never done   COVID-19 Vaccine (3 - 2024-25 season) 05/21/2023   INFLUENZA VACCINE  04/19/2024   MAMMOGRAM  11/22/2025   HPV VACCINES  Aged Out   Meningococcal B Vaccine  Aged Out   Hepatitis C Screening  Discontinued   HIV Screening  Discontinued     GI and Gyn referrals ordered     See Problem List for Assessment and Plan of chronic medical problems.

## 2024-02-09 ENCOUNTER — Ambulatory Visit (INDEPENDENT_AMBULATORY_CARE_PROVIDER_SITE_OTHER): Admitting: Internal Medicine

## 2024-02-09 VITALS — BP 118/82 | HR 80 | Temp 97.8°F | Ht 64.0 in | Wt 140.2 lb

## 2024-02-09 DIAGNOSIS — E039 Hypothyroidism, unspecified: Secondary | ICD-10-CM | POA: Insufficient documentation

## 2024-02-09 DIAGNOSIS — R739 Hyperglycemia, unspecified: Secondary | ICD-10-CM | POA: Insufficient documentation

## 2024-02-09 DIAGNOSIS — Z0000 Encounter for general adult medical examination without abnormal findings: Secondary | ICD-10-CM | POA: Diagnosis not present

## 2024-02-09 DIAGNOSIS — G43511 Persistent migraine aura without cerebral infarction, intractable, with status migrainosus: Secondary | ICD-10-CM | POA: Diagnosis not present

## 2024-02-09 DIAGNOSIS — Z124 Encounter for screening for malignant neoplasm of cervix: Secondary | ICD-10-CM

## 2024-02-09 DIAGNOSIS — Z1211 Encounter for screening for malignant neoplasm of colon: Secondary | ICD-10-CM

## 2024-02-09 DIAGNOSIS — R8290 Unspecified abnormal findings in urine: Secondary | ICD-10-CM | POA: Insufficient documentation

## 2024-02-09 LAB — COMPREHENSIVE METABOLIC PANEL WITH GFR
ALT: 12 U/L (ref 0–35)
AST: 22 U/L (ref 0–37)
Albumin: 4.4 g/dL (ref 3.5–5.2)
Alkaline Phosphatase: 59 U/L (ref 39–117)
BUN: 21 mg/dL (ref 6–23)
CO2: 29 meq/L (ref 19–32)
Calcium: 9.3 mg/dL (ref 8.4–10.5)
Chloride: 104 meq/L (ref 96–112)
Creatinine, Ser: 0.82 mg/dL (ref 0.40–1.20)
GFR: 82.22 mL/min (ref >60.00)
Glucose, Bld: 94 mg/dL (ref 70–99)
Potassium: 4 meq/L (ref 3.5–5.1)
Sodium: 139 meq/L (ref 135–145)
Total Bilirubin: 1 mg/dL (ref 0.2–1.2)
Total Protein: 7 g/dL (ref 6.0–8.3)

## 2024-02-09 LAB — CBC WITH DIFFERENTIAL/PLATELET
Basophils Absolute: 0.1 10*3/uL (ref 0.0–0.1)
Basophils Relative: 0.8 % (ref 0.0–3.0)
Eosinophils Absolute: 0.1 10*3/uL (ref 0.0–0.7)
Eosinophils Relative: 0.7 % (ref 0.0–5.0)
HCT: 42.1 % (ref 36.0–46.0)
Hemoglobin: 14.2 g/dL (ref 12.0–15.0)
Lymphocytes Relative: 34.5 % (ref 12.0–46.0)
Lymphs Abs: 2.9 10*3/uL (ref 0.7–4.0)
MCHC: 33.7 g/dL (ref 30.0–36.0)
MCV: 87.1 fl (ref 78.0–100.0)
Monocytes Absolute: 0.4 10*3/uL (ref 0.1–1.0)
Monocytes Relative: 5.2 % (ref 3.0–12.0)
Neutro Abs: 4.9 10*3/uL (ref 1.4–7.7)
Neutrophils Relative %: 58.8 % (ref 43.0–77.0)
Platelets: 331 10*3/uL (ref 150.0–400.0)
RBC: 4.83 Mil/uL (ref 3.87–5.11)
RDW: 13.1 % (ref 11.5–15.5)
WBC: 8.3 10*3/uL (ref 4.0–10.5)

## 2024-02-09 LAB — LIPID PANEL
Cholesterol: 172 mg/dL (ref 0–200)
HDL: 52.7 mg/dL (ref >39.00)
LDL Cholesterol: 106 mg/dL — ABNORMAL HIGH (ref 0–99)
NonHDL: 119.15
Total CHOL/HDL Ratio: 3
Triglycerides: 64 mg/dL (ref 0.0–149.0)
VLDL: 12.8 mg/dL (ref 0.0–40.0)

## 2024-02-09 LAB — TSH: TSH: 0.08 u[IU]/mL — ABNORMAL LOW (ref 0.35–5.50)

## 2024-02-09 LAB — HEMOGLOBIN A1C: Hgb A1c MFr Bld: 5.5 % (ref 4.6–6.5)

## 2024-02-09 MED ORDER — UBRELVY 100 MG PO TABS: ORAL_TABLET | ORAL | 11 refills | Status: AC

## 2024-02-09 NOTE — Assessment & Plan Note (Signed)
 Chronic Controlled, stable Continue ubrelvy  100 mg prn

## 2024-02-09 NOTE — Assessment & Plan Note (Signed)
 Famhx of DM Check a1c

## 2024-02-09 NOTE — Assessment & Plan Note (Signed)
 Chronic  Clinically euthyroid Check tsh  Currently taking armour thyroid  - managed by [facility]

## 2024-02-09 NOTE — Addendum Note (Signed)
 Addended by: Colene Dauphin on: 02/09/2024 02:28 PM   Modules accepted: Orders

## 2024-02-09 NOTE — Assessment & Plan Note (Signed)
Ua, ucx 

## 2024-02-11 ENCOUNTER — Ambulatory Visit: Payer: Self-pay | Admitting: Internal Medicine

## 2024-02-14 ENCOUNTER — Telehealth: Payer: Self-pay | Admitting: Pharmacist

## 2024-02-14 ENCOUNTER — Other Ambulatory Visit (HOSPITAL_COMMUNITY): Payer: Self-pay

## 2024-02-14 NOTE — Telephone Encounter (Signed)
 Pharmacy Patient Advocate Encounter   Received notification from CoverMyMeds that prior authorization for Ubrelvy  50MG  tabletsis required/requested.   Insurance verification completed.   The patient is insured through [facility]  PA is not needed at this time.

## 2024-03-08 NOTE — Progress Notes (Signed)
 [name] [address] [place] [address] Phone: [phone] Subjective:   LILLETTE Madison [name], am serving as a scribe for Dr. Arthea Claudene.  I'm seeing this patient by the request  of:  Geofm Glade PARAS, MD  CC: back pain f/u   Madison [name]  Madison [name] is a 53 y.o. female coming in with complaint of back and neck pain. OMT on 01/30/2024. Patient states that she has some pain in L trap and lumbar spine. No new symptoms.   Medications patient has been prescribed:   Taking:         Reviewed prior external information including notes and imaging from previsou exam, outside providers and external EMR if available.   As well as notes that were available from care everywhere and other healthcare systems.  Past medical history, social, surgical and family history all reviewed in electronic medical record.  No pertanent information unless stated regarding to the chief complaint.   Past Medical History:  Diagnosis Date   Migraine     Allergies  Allergen Reactions   Lactose Intolerance (Gi)     Gives her migraines     Review of Systems:  No , visual changes, nausea, vomiting, diarrhea, constipation, dizziness, abdominal pain, skin rash, fevers, chills, night sweats, weight loss, swollen lymph nodes, body aches, joint swelling, chest pain, shortness of breath, mood changes. POSITIVE muscle aches, headache   Objective  Blood pressure 128/82, height 5' 4 (1.626 m), weight 127 lb (57.6 kg).   General: No apparent distress alert and oriented x3 mood and affect normal, dressed appropriately.  HEENT: Pupils equal, extraocular movements intact  Respiratory: Patient's speak in full sentences and does not appear short of breath  Cardiovascular: No lower extremity edema, non tender, no erythema  Gait MSK:  Back low back pain mild tightness in the neck patient has had some tightness in the lower in the neck.  Seems to be of the left side as  well as the left parascapular area at this time.  No masses appreciated.  Some trigger points though are noted in the parascapular area on the left side.  Osteopathic findings  C3 flexed rotated and side bent left C7 flexed rotated and side bent left T5 extended rotated and side bent left inhaled rib T9 extended rotated and side bent left L2 flexed rotated and side bent right Sacrum right on right       Assessment and Plan:  Cervicogenic headache Discussed home exercises, has muscle relaxers to take at night if necessary.  Think it may be a good idea from the tightness.  Some increasing stress but will be changing her job in the relatively near future.  Do think that will be significantly helpful for this individual.  Follow-up with me again in 6 to 8 weeks.    Nonallopathic problems  Decision today to treat with OMT was based on Physical Exam  After verbal consent patient was treated with HVLA, ME, FPR techniques in cervical, rib, thoracic, lumbar, and sacral  areas  Patient tolerated the procedure well with improvement in symptoms  Patient given exercises, stretches and lifestyle modifications  See medications in patient instructions if given  Patient will follow up in 4-8 weeks     The above documentation has been reviewed and is accurate and complete Magalene Mclear M Destinee Taber, DO         Note: This dictation was prepared with Dragon dictation along with smaller phrase technology. Any transcriptional errors  that result from this process are unintentional.

## 2024-03-14 ENCOUNTER — Ambulatory Visit (INDEPENDENT_AMBULATORY_CARE_PROVIDER_SITE_OTHER): Payer: Self-pay | Admitting: Family Medicine

## 2024-03-14 VITALS — BP 128/82 | Ht 64.0 in | Wt 127.0 lb

## 2024-03-14 DIAGNOSIS — M9908 Segmental and somatic dysfunction of rib cage: Secondary | ICD-10-CM | POA: Diagnosis not present

## 2024-03-14 DIAGNOSIS — M9904 Segmental and somatic dysfunction of sacral region: Secondary | ICD-10-CM

## 2024-03-14 DIAGNOSIS — M9902 Segmental and somatic dysfunction of thoracic region: Secondary | ICD-10-CM

## 2024-03-14 DIAGNOSIS — M9903 Segmental and somatic dysfunction of lumbar region: Secondary | ICD-10-CM | POA: Diagnosis not present

## 2024-03-14 DIAGNOSIS — M9901 Segmental and somatic dysfunction of cervical region: Secondary | ICD-10-CM

## 2024-03-14 DIAGNOSIS — G4486 Cervicogenic headache: Secondary | ICD-10-CM

## 2024-03-14 NOTE — Patient Instructions (Signed)
 Take baclofen  nightly for 3 night One month to go! See me in 2months

## 2024-03-15 ENCOUNTER — Encounter: Payer: Self-pay | Admitting: Family Medicine

## 2024-03-15 NOTE — Assessment & Plan Note (Addendum)
 Discussed home exercises, has muscle relaxers to take at night if necessary.  Think it may be a good idea from the tightness.  Some increasing stress but will be changing her job in the relatively near future.  Do think that will be significantly helpful for this individual.  Follow-up with me again in 6 to 8 weeks.

## 2024-04-15 ENCOUNTER — Other Ambulatory Visit (HOSPITAL_COMMUNITY): Payer: Self-pay

## 2024-04-15 ENCOUNTER — Telehealth: Payer: Self-pay

## 2024-04-15 NOTE — Telephone Encounter (Signed)
 Pharmacy Patient Advocate Encounter   Received notification from CoverMyMeds that prior authorization for Ubrelvy  100 tabs is required/requested.   Insurance verification completed.   The patient is insured through [facility] .   Per test claim: PA required; PA submitted to above mentioned insurance via CoverMyMeds Key/confirmation #/[id number] Status is pending

## 2024-04-15 NOTE — Telephone Encounter (Signed)
 Pharmacy Patient Advocate Encounter  Received notification from [facility] that Prior Authorization for Ubrelvy  100 has been APPROVED from 04/15/24 to 04/15/25. Ran test claim, Copay is $0.00. This test claim was processed through [facility]- copay amounts may vary at other pharmacies due to pharmacy/plan contracts, or as the patient moves through the different stages of their insurance plan.   PA #/Case ID/Reference #: [id number]

## 2024-05-06 ENCOUNTER — Encounter: Payer: Self-pay | Admitting: Family Medicine

## 2024-05-07 ENCOUNTER — Encounter: Payer: Self-pay | Admitting: Family Medicine

## 2024-05-07 ENCOUNTER — Ambulatory Visit (INDEPENDENT_AMBULATORY_CARE_PROVIDER_SITE_OTHER): Admitting: Family Medicine

## 2024-05-07 VITALS — BP 118/84 | HR 96 | Ht 64.0 in | Wt 139.0 lb

## 2024-05-07 DIAGNOSIS — M9901 Segmental and somatic dysfunction of cervical region: Secondary | ICD-10-CM | POA: Diagnosis not present

## 2024-05-07 DIAGNOSIS — M9902 Segmental and somatic dysfunction of thoracic region: Secondary | ICD-10-CM

## 2024-05-07 DIAGNOSIS — M9908 Segmental and somatic dysfunction of rib cage: Secondary | ICD-10-CM

## 2024-05-07 DIAGNOSIS — M25512 Pain in left shoulder: Secondary | ICD-10-CM | POA: Insufficient documentation

## 2024-05-07 DIAGNOSIS — G4486 Cervicogenic headache: Secondary | ICD-10-CM

## 2024-05-07 NOTE — Assessment & Plan Note (Signed)
 Patient given trigger point injections today.  Tolerated the procedure well, discussed icing regimen and home exercises, discussed which activities to do and which ones to avoid and which ones would be beneficial.  Increase activity slowly.  Follow-up again in 6 to 12 weeks.

## 2024-05-07 NOTE — Progress Notes (Signed)
 [name] [address] [place] [address] Phone: [phone] Subjective:   ISusannah Gully, am serving as a scribe for Dr. Arthea Claudene.  I'm seeing this patient by the request  of:  Geofm Glade PARAS, MD  CC: Neck pain, headache, low back pain  YEP:Dlagzrupcz  Fatim Peele-Page is a 53 y.o. female coming in with complaint of back and neck pain. Patient states has had 5 migraines in 2 weeks. Has had dizziness and nausea associated with migraines. Feels like it radiates down left arm.          Reviewed prior external information including notes and imaging from previsou exam, outside providers and external EMR if available.   As well as notes that were available from care everywhere and other healthcare systems.  Past medical history, social, surgical and family history all reviewed in electronic medical record.  No pertanent information unless stated regarding to the chief complaint.   Past Medical History:  Diagnosis Date   Migraine     Allergies  Allergen Reactions   Lactose Intolerance (Gi)     Gives her migraines     Review of Systems:  No  visual changes, nausea, vomiting, diarrhea, constipation, dizziness, abdominal pain, skin rash, fevers, chills, night sweats, weight loss, swollen lymph nodes, body aches, joint swelling, chest pain, shortness of breath, mood changes. POSITIVE muscle aches, headache  Objective  Blood pressure 118/84, pulse 96, height 5' 4 (1.626 m), weight 139 lb (63 kg), SpO2 98%.   General: No apparent distress alert and oriented x3 mood and affect normal, dressed appropriately.  HEENT: Pupils equal, extraocular movements intact  Respiratory: Patient's speak in full sentences and does not appear short of breath  Cardiovascular: No lower extremity edema, non tender, no erythema  Gait relatively normal MSK:  Back overall nothing too severe except that there is multiple trigger points noted in the left  periscapular area.  Neck exam significant tightness noted as well.  Tightness noted in the occipital area on the left greater than right.  Trigger points are in the trapezius, rhomboid, levator scapula.  With a 25-gauge half inch needle injected into 4 distinct trigger points along the levator scapula, rhomboid, trapezius muscle as well as the latissimus dorsi.  Minimal blood loss noted.  Total of 3 cc 0.5% Marcaine and 1 cc of Kenalog  40 mg/mL used.  Band-Aid placed.  Postinjection instructions given.   Osteopathic findings  C3 flexed rotated and side bent right C7 flexed rotated and side bent left T3 extended rotated and side bent left inhaled rib T7 extended rotated and side bent left T 11 E RS left         Assessment and Plan:  Trigger point of left shoulder region Patient given trigger point injections today.  Tolerated the procedure well, discussed icing regimen and home exercises, discussed which activities to do and which ones to avoid and which ones would be beneficial.  Increase activity slowly.  Follow-up again in 6 to 12 weeks.    Nonallopathic problems  Decision today to treat with OMT was based on Physical Exam  After verbal consent patient was treated with HVLA, ME, FPR techniques in cervical, rib, thoracic areas  Patient tolerated the procedure well with improvement in symptoms  Patient given exercises, stretches and lifestyle modifications  See medications in patient instructions if given  Patient will follow up in 4-8 weeks    The above documentation has been reviewed and is accurate  and complete Burnett Lieber M Nashid Pellum, DO          Note: This dictation was prepared with Dragon dictation along with smaller phrase technology. Any transcriptional errors that result from this process are unintentional.

## 2024-05-07 NOTE — Assessment & Plan Note (Signed)
 Some of patient's headaches are cervicogenic.  Discussed icing regimen and home exercises, discussed which activities to do and which ones to avoid.  Increase activity slowly.  Discussed icing regimen.  Will follow-up again in 6 to 8 weeks.

## 2024-05-07 NOTE — Patient Instructions (Addendum)
 Trigger point injections today Good to see you! See you again in 5-6 weeks Enjoy the beach

## 2024-05-15 ENCOUNTER — Ambulatory Visit: Admitting: Family Medicine

## 2024-06-04 ENCOUNTER — Ambulatory Visit
Admission: RE | Admit: 2024-06-04 | Discharge: 2024-06-04 | Disposition: A | Discharge status: Home / Self Care | Source: Ambulatory Visit | Attending: Internal Medicine | Admitting: Internal Medicine

## 2024-06-04 DIAGNOSIS — N6489 Other specified disorders of breast: Secondary | ICD-10-CM

## 2024-06-19 ENCOUNTER — Ambulatory Visit: Admitting: Family Medicine

## 2024-07-10 ENCOUNTER — Ambulatory Visit: Admitting: Family Medicine

## 2024-08-05 NOTE — Progress Notes (Unsigned)
 [name] [address] [place] [address] Phone: [phone] Subjective:   [name], am serving as a scribe for Dr. Arthea Claudene.  I'm seeing this patient by the request  of:  Madison Glade PARAS, MD  CC: Neck pain and headache  YEP:Dlagzrupcz  05/07/2024 Patient given trigger point injections today.  Tolerated the procedure well, discussed icing regimen and home exercises, discussed which activities to do and which ones to avoid and which ones would be beneficial.  Increase activity slowly.  Follow-up again in 6 to 12 weeks.     Some of patient's headaches are cervicogenic.  Discussed icing regimen and home exercises, discussed which activities to do and which ones to avoid.  Increase activity slowly.  Discussed icing regimen.  Will follow-up again in 6 to 8 weeks.     Updated 08/06/2024 Madison [name] is a 53 y.o. female coming in with complaint of shoulder pain. Trigger point injections and dry needling helps, but only temporarily. 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 She lives with her husband and 2  teenagers in a 2 story house. They have one dog. Patient drinks 1 cup of coffee a day. She walks 2-3 miles, goes to the gym or does yoga daily.    Social Drivers of Corporate Investment Banker Strain: Not on file  Food Insecurity: Not on file  Transportation Needs: Not on file  Physical Activity: Not on file  Stress: Not on file  Social Connections: Unknown (01/28/2022)   Received from [facility]   Social Network    Social Network: Not on file   Allergies  Allergen Reactions   Lactose Intolerance (Gi)     Gives her migraines   Family History  Problem Relation Age of Onset   Depression Mother    Hypertension Father    Diabetes Father     Current Outpatient Medications (Endocrine & Metabolic):    ARMOUR THYROID  30 MG tablet, Take 30 mg by mouth daily.   ARMOUR THYROID  60 MG tablet, Take by mouth.   progesterone  (PROMETRIUM ) 100 MG capsule, Take 100 mg by mouth at bedtime.    Current Outpatient Medications (Analgesics):    meloxicam  (MOBIC ) 15 MG tablet, TAKE 1 TABLET(15 MG) BY MOUTH DAILY   Ubrogepant  (UBRELVY ) 100 MG TABS, TAKE 1 TABLET BY MOUTH 1 TIME AS NEEDED FOR MIGRAINE. MAY REPEAT 1 TIME AFTER 2 HOURS   Current Outpatient Medications (Other):    baclofen  (  LIORESAL ) 10 MG tablet, TAKE 1 TABLET(10 MG) BY MOUTH THREE TIMES DAILY AS NEEDED FOR MUSCLE SPASMS   Biotin 5000 MCG CAPS, Take by mouth.   Cholecalciferol (VITAMIN D3) 1000 units CAPS, Take 1 capsule by mouth daily.   MAGNESIUM MALATE PO, Take by mouth.   Menaquinone-7 (VITAMIN K2) 100 MCG CAPS, Take 1 capsule by mouth daily.   Multiple Vitamin (MULTIVITAMIN) tablet, Take 1 tablet by mouth daily.   Probiotic Product (PROBIOTIC-10 PO), Take 1 capsule by mouth daily.   RESTASIS 0.05 % ophthalmic emulsion,    Zinc  30 MG TABS, Take by mouth.   Reviewed prior external information including notes and imaging from  primary care provider As well as notes that were available from care everywhere and other  healthcare systems.  Past medical history, social, surgical and family history all reviewed in electronic medical record.  No pertanent information unless stated regarding to the chief complaint.   Review of Systems:  No  visual changes, nausea, vomiting, diarrhea, constipation, dizziness, abdominal pain, skin rash, fevers, chills, night sweats, weight loss, swollen lymph nodes, body aches, joint swelling, chest pain, shortness of breath, mood changes. POSITIVE muscle aches, headache  Objective  Blood pressure 120/78, pulse 79, height 5' 4 (1.626 m), weight 141 lb (64 kg), SpO2 99%.   General: No apparent distress alert and oriented x3 mood and affect normal, dressed appropriately.  HEENT: Pupils equal, extraocular movements intact  Respiratory: Patient's speak in full sentences and does not appear short of breath  Cardiovascular: No lower extremity edema, non tender, no erythema  Neck exam shows significant tightness noted.  Seems to be given her severe amount of discomfort even with extension of the neck noted.  More difficulty sidebending left greater than right.  Positive Spurling's noted with radicular symptoms in the left C8 distribution  Osteopathic findings  C2 flexed rotated and side bent right C4 flexed rotated and side bent left C7 flexed rotated and side bent left T3 extended rotated and side bent left inhaled third rib T7 extended rotated and side bent left L2 flexed rotated and side bent right Sacrum right on right     Impression and Recommendations:    Cervical radiculopathy Concern with patient having more discomfort and cervical symptoms.  Attempted osteopathic manipulation again but and urged patient to consider the possibility of advanced imaging with patient having this pain that is now starting to affect even daily activities and patient sleep.  The patient's quality of life is seeming to decrease at the moment.  Follow-up again in 6 to 8 weeks  otherwise.     Decision today to treat with OMT was based on Physical Exam  After verbal consent patient was treated with HVLA, ME, FPR techniques in cervical, thoracic, rib, lumbar and sacral areas, all areas are chronic   Patient tolerated the procedure well with improvement in symptoms  Patient given exercises, stretches and lifestyle modifications  See medications in patient instructions if given  Patient will follow up in 4-8 weeks  The above documentation has been reviewed and is accurate and complete Arushi Partridge M Ajahnae Rathgeber, DO

## 2024-08-06 ENCOUNTER — Ambulatory Visit (INDEPENDENT_AMBULATORY_CARE_PROVIDER_SITE_OTHER): Admitting: Family Medicine

## 2024-08-06 ENCOUNTER — Encounter: Payer: Self-pay | Admitting: Family Medicine

## 2024-08-06 ENCOUNTER — Ambulatory Visit

## 2024-08-06 VITALS — BP 120/78 | HR 79 | Ht 64.0 in | Wt 141.0 lb

## 2024-08-06 DIAGNOSIS — M5412 Radiculopathy, cervical region: Secondary | ICD-10-CM

## 2024-08-06 DIAGNOSIS — M9908 Segmental and somatic dysfunction of rib cage: Secondary | ICD-10-CM

## 2024-08-06 DIAGNOSIS — M9903 Segmental and somatic dysfunction of lumbar region: Secondary | ICD-10-CM

## 2024-08-06 DIAGNOSIS — M9901 Segmental and somatic dysfunction of cervical region: Secondary | ICD-10-CM | POA: Diagnosis not present

## 2024-08-06 DIAGNOSIS — M9904 Segmental and somatic dysfunction of sacral region: Secondary | ICD-10-CM

## 2024-08-06 DIAGNOSIS — M9902 Segmental and somatic dysfunction of thoracic region: Secondary | ICD-10-CM

## 2024-08-06 NOTE — Patient Instructions (Signed)
 [facility] [phone] Call Today  When we receive your results we will contact you.  See you again in 2-3 months

## 2024-08-06 NOTE — Assessment & Plan Note (Signed)
 Concern with patient having more discomfort and cervical symptoms.  Attempted osteopathic manipulation again but and urged patient to consider the possibility of advanced imaging with patient having this pain that is now starting to affect even daily activities and patient sleep.  The patient's quality of life is seeming to decrease at the moment.  Follow-up again in 6 to 8 weeks otherwise.

## 2024-08-11 ENCOUNTER — Ambulatory Visit: Payer: Self-pay | Admitting: Family Medicine

## 2024-10-08 ENCOUNTER — Ambulatory Visit: Admitting: Family Medicine

## 2024-10-18 ENCOUNTER — Other Ambulatory Visit (HOSPITAL_COMMUNITY): Payer: Self-pay

## 2024-10-18 ENCOUNTER — Telehealth: Payer: Self-pay

## 2024-10-18 NOTE — Telephone Encounter (Signed)
 Pharmacy Patient Advocate Encounter   Received notification from [facility] KEY that prior authorization for Ubrelvy  100 mg is required/requested.   Insurance verification completed.   The patient is insured through [facility].   Per test claim: PA required; PA submitted to above mentioned insurance via Latent Key/confirmation #/EOC [id number] Status is pending

## 2024-10-22 ENCOUNTER — Other Ambulatory Visit (HOSPITAL_COMMUNITY): Payer: Self-pay

## 2025-02-12 ENCOUNTER — Encounter: Admitting: Internal Medicine
